# Patient Record
Sex: Female | Born: 1940
Health system: Southern US, Community
[De-identification: ages and names within clinical notes are randomized; demographics above are authoritative.]

## PROBLEM LIST (undated history)

## (undated) DIAGNOSIS — M1990 Unspecified osteoarthritis, unspecified site: Secondary | ICD-10-CM

## (undated) DIAGNOSIS — M8580 Other specified disorders of bone density and structure, unspecified site: Secondary | ICD-10-CM

## (undated) DIAGNOSIS — R1013 Epigastric pain: Secondary | ICD-10-CM

## (undated) DIAGNOSIS — Z8619 Personal history of other infectious and parasitic diseases: Secondary | ICD-10-CM

## (undated) DIAGNOSIS — Z789 Other specified health status: Secondary | ICD-10-CM

## (undated) DIAGNOSIS — I10 Essential (primary) hypertension: Secondary | ICD-10-CM

## (undated) DIAGNOSIS — Z9889 Other specified postprocedural states: Secondary | ICD-10-CM

## (undated) HISTORY — DX: Other specified disorders of bone density and structure, unspecified site: M85.80

## (undated) HISTORY — DX: Epigastric pain: R10.13

## (undated) HISTORY — DX: Other specified health status: Z78.9

## (undated) HISTORY — DX: Other specified postprocedural states: Z98.890

## (undated) HISTORY — PX: TUBAL LIGATION: SHX77

## (undated) HISTORY — PX: COLONOSCOPY: SHX174

## (undated) HISTORY — DX: Essential (primary) hypertension: I10

## (undated) HISTORY — DX: Unspecified osteoarthritis, unspecified site: M19.90

## (undated) HISTORY — PX: UPPER GASTROINTESTINAL ENDOSCOPY: SHX188

## (undated) HISTORY — DX: Personal history of other infectious and parasitic diseases: Z86.19

---

## 1978-01-06 HISTORY — PX: BREAST LUMPECTOMY: SHX2

## 2005-05-20 LAB — HM COLONOSCOPY: HM Colonoscopy: NORMAL

## 2005-06-06 DIAGNOSIS — Z9889 Other specified postprocedural states: Secondary | ICD-10-CM

## 2005-06-06 HISTORY — DX: Other specified postprocedural states: Z98.890

## 2005-06-10 ENCOUNTER — Encounter: Payer: Self-pay | Admitting: Gastroenterology

## 2005-10-08 ENCOUNTER — Encounter: Payer: Self-pay | Admitting: Internal Medicine

## 2005-10-11 ENCOUNTER — Encounter: Payer: Self-pay | Admitting: Internal Medicine

## 2007-02-09 ENCOUNTER — Encounter: Payer: Self-pay | Admitting: Internal Medicine

## 2007-02-22 LAB — CONVERTED CEMR LAB: Pap Smear: NORMAL

## 2007-07-28 ENCOUNTER — Ambulatory Visit: Payer: Self-pay | Admitting: Internal Medicine

## 2007-07-28 DIAGNOSIS — M810 Age-related osteoporosis without current pathological fracture: Secondary | ICD-10-CM

## 2007-08-03 ENCOUNTER — Encounter: Payer: Self-pay | Admitting: Internal Medicine

## 2007-10-04 ENCOUNTER — Ambulatory Visit: Payer: Self-pay | Admitting: Internal Medicine

## 2007-10-04 DIAGNOSIS — K219 Gastro-esophageal reflux disease without esophagitis: Secondary | ICD-10-CM | POA: Insufficient documentation

## 2007-10-04 DIAGNOSIS — I10 Essential (primary) hypertension: Secondary | ICD-10-CM

## 2007-10-06 ENCOUNTER — Ambulatory Visit: Payer: Self-pay | Admitting: Internal Medicine

## 2007-10-11 ENCOUNTER — Encounter: Payer: Self-pay | Admitting: Internal Medicine

## 2007-10-11 ENCOUNTER — Ambulatory Visit: Payer: Self-pay

## 2007-10-12 ENCOUNTER — Ambulatory Visit: Payer: Self-pay | Admitting: Internal Medicine

## 2007-10-19 ENCOUNTER — Telehealth: Payer: Self-pay | Admitting: Internal Medicine

## 2007-11-01 ENCOUNTER — Ambulatory Visit: Payer: Self-pay | Admitting: Gastroenterology

## 2007-11-01 DIAGNOSIS — R1013 Epigastric pain: Secondary | ICD-10-CM | POA: Insufficient documentation

## 2007-12-07 ENCOUNTER — Ambulatory Visit: Payer: Self-pay | Admitting: Internal Medicine

## 2007-12-07 LAB — CONVERTED CEMR LAB
AST: 23 units/L (ref 0–37)
Calcium: 8.6 mg/dL (ref 8.4–10.5)
Creatinine, Ser: 0.4 mg/dL (ref 0.4–1.2)
GFR calc Af Amer: 206 mL/min
GFR calc non Af Amer: 170 mL/min
HDL: 53.2 mg/dL (ref >39.0)
LDL Cholesterol: 99 mg/dL (ref 0–99)
Total CHOL/HDL Ratio: 3.5
Triglycerides: 170 mg/dL — ABNORMAL HIGH (ref 0–149)
VLDL: 34 mg/dL (ref 0–40)

## 2007-12-09 ENCOUNTER — Encounter: Payer: Self-pay | Admitting: Gastroenterology

## 2007-12-09 ENCOUNTER — Ambulatory Visit: Payer: Self-pay | Admitting: Gastroenterology

## 2007-12-13 ENCOUNTER — Ambulatory Visit: Payer: Self-pay | Admitting: Internal Medicine

## 2007-12-14 ENCOUNTER — Encounter: Payer: Self-pay | Admitting: Gastroenterology

## 2008-01-10 ENCOUNTER — Ambulatory Visit: Payer: Self-pay | Admitting: Gastroenterology

## 2008-01-10 DIAGNOSIS — K222 Esophageal obstruction: Secondary | ICD-10-CM

## 2008-02-16 ENCOUNTER — Ambulatory Visit: Payer: Self-pay | Admitting: Internal Medicine

## 2008-02-16 DIAGNOSIS — H6690 Otitis media, unspecified, unspecified ear: Secondary | ICD-10-CM | POA: Insufficient documentation

## 2008-03-16 ENCOUNTER — Ambulatory Visit (HOSPITAL_BASED_OUTPATIENT_CLINIC_OR_DEPARTMENT_OTHER): Admission: RE | Admit: 2008-03-16 | Discharge: 2008-03-16 | Payer: Self-pay | Admitting: Internal Medicine

## 2008-03-16 ENCOUNTER — Ambulatory Visit: Payer: Self-pay | Admitting: Diagnostic Radiology

## 2008-03-16 ENCOUNTER — Ambulatory Visit: Payer: Self-pay | Admitting: Internal Medicine

## 2008-03-16 DIAGNOSIS — R05 Cough: Secondary | ICD-10-CM

## 2008-03-30 ENCOUNTER — Ambulatory Visit: Payer: Self-pay | Admitting: Internal Medicine

## 2008-04-20 ENCOUNTER — Ambulatory Visit (HOSPITAL_BASED_OUTPATIENT_CLINIC_OR_DEPARTMENT_OTHER): Admission: RE | Admit: 2008-04-20 | Discharge: 2008-04-20 | Payer: Self-pay | Admitting: Internal Medicine

## 2008-04-20 ENCOUNTER — Ambulatory Visit: Payer: Self-pay | Admitting: Diagnostic Radiology

## 2008-04-20 LAB — HM MAMMOGRAPHY: HM Mammogram: NEGATIVE

## 2008-05-22 ENCOUNTER — Ambulatory Visit: Payer: Self-pay | Admitting: Internal Medicine

## 2008-05-22 DIAGNOSIS — L03119 Cellulitis of unspecified part of limb: Secondary | ICD-10-CM

## 2008-05-22 DIAGNOSIS — L02419 Cutaneous abscess of limb, unspecified: Secondary | ICD-10-CM | POA: Insufficient documentation

## 2008-07-31 ENCOUNTER — Encounter: Payer: Self-pay | Admitting: Internal Medicine

## 2008-08-06 ENCOUNTER — Ambulatory Visit: Payer: Self-pay | Admitting: Interventional Radiology

## 2008-08-06 ENCOUNTER — Emergency Department (HOSPITAL_BASED_OUTPATIENT_CLINIC_OR_DEPARTMENT_OTHER): Admission: EM | Admit: 2008-08-06 | Discharge: 2008-08-06 | Payer: Self-pay | Admitting: Emergency Medicine

## 2008-10-02 ENCOUNTER — Ambulatory Visit: Payer: Self-pay | Admitting: Internal Medicine

## 2008-10-18 ENCOUNTER — Telehealth: Payer: Self-pay | Admitting: Internal Medicine

## 2008-10-26 ENCOUNTER — Ambulatory Visit: Payer: Self-pay | Admitting: Internal Medicine

## 2008-10-31 ENCOUNTER — Telehealth: Payer: Self-pay | Admitting: Internal Medicine

## 2008-11-09 ENCOUNTER — Ambulatory Visit: Payer: Self-pay | Admitting: Internal Medicine

## 2008-11-09 LAB — CONVERTED CEMR LAB
Albumin: 4.4 g/dL (ref 3.5–5.2)
Alkaline Phosphatase: 48 units/L (ref 39–117)
CO2: 24 meq/L (ref 19–32)
Chloride: 107 meq/L (ref 96–112)
HDL: 69 mg/dL (ref >39)
LDL Cholesterol: 121 mg/dL — ABNORMAL HIGH (ref 0–99)
Potassium: 4.6 meq/L (ref 3.5–5.3)
Sodium: 143 meq/L (ref 135–145)
TSH: 3.845 microintl units/mL (ref 0.350–4.500)
Total Bilirubin: 0.5 mg/dL (ref 0.3–1.2)
Total CHOL/HDL Ratio: 3
Total Protein: 6.7 g/dL (ref 6.0–8.3)
Triglycerides: 102 mg/dL (ref <150)

## 2008-11-16 ENCOUNTER — Ambulatory Visit: Payer: Self-pay | Admitting: Internal Medicine

## 2008-12-26 ENCOUNTER — Telehealth: Payer: Self-pay | Admitting: Internal Medicine

## 2009-01-18 ENCOUNTER — Encounter (INDEPENDENT_AMBULATORY_CARE_PROVIDER_SITE_OTHER): Payer: Self-pay | Admitting: *Deleted

## 2009-01-18 ENCOUNTER — Ambulatory Visit: Payer: Self-pay | Admitting: Internal Medicine

## 2009-01-18 DIAGNOSIS — K120 Recurrent oral aphthae: Secondary | ICD-10-CM | POA: Insufficient documentation

## 2009-01-18 DIAGNOSIS — H538 Other visual disturbances: Secondary | ICD-10-CM

## 2009-01-18 DIAGNOSIS — J309 Allergic rhinitis, unspecified: Secondary | ICD-10-CM | POA: Insufficient documentation

## 2009-03-15 ENCOUNTER — Ambulatory Visit: Payer: Self-pay | Admitting: Internal Medicine

## 2009-03-15 LAB — CONVERTED CEMR LAB
BUN: 15 mg/dL (ref 6–23)
Creatinine, Ser: 0.61 mg/dL (ref 0.40–1.20)
Glucose, Bld: 78 mg/dL (ref 70–99)
Potassium: 4.5 meq/L (ref 3.5–5.3)
Sodium: 143 meq/L (ref 135–145)

## 2009-03-22 ENCOUNTER — Ambulatory Visit: Payer: Self-pay | Admitting: Internal Medicine

## 2009-03-27 LAB — CONVERTED CEMR LAB: Pap Smear: NORMAL

## 2009-04-26 ENCOUNTER — Ambulatory Visit: Payer: Self-pay | Admitting: Family

## 2009-09-07 ENCOUNTER — Ambulatory Visit: Payer: Self-pay | Admitting: Internal Medicine

## 2009-09-17 ENCOUNTER — Encounter: Payer: Self-pay | Admitting: Internal Medicine

## 2009-09-17 LAB — CONVERTED CEMR LAB
BUN: 13 mg/dL (ref 6–23)
Chloride: 108 meq/L (ref 96–112)
Cholesterol: 186 mg/dL (ref 0–200)
FSH: 19.2 milliintl units/mL
Glucose, Bld: 139 mg/dL — ABNORMAL HIGH (ref 70–99)
LDL Cholesterol: 95 mg/dL (ref 0–99)
Potassium: 4.6 meq/L (ref 3.5–5.3)
VLDL: 28 mg/dL (ref 0–40)

## 2009-09-18 ENCOUNTER — Encounter: Payer: Self-pay | Admitting: Internal Medicine

## 2009-09-18 LAB — CONVERTED CEMR LAB: TSH: 3.159 microintl units/mL (ref 0.350–4.500)

## 2009-09-24 ENCOUNTER — Encounter (INDEPENDENT_AMBULATORY_CARE_PROVIDER_SITE_OTHER): Payer: Self-pay | Admitting: *Deleted

## 2009-09-24 ENCOUNTER — Telehealth: Payer: Self-pay | Admitting: Gastroenterology

## 2009-09-24 ENCOUNTER — Ambulatory Visit: Payer: Self-pay | Admitting: Internal Medicine

## 2009-09-24 DIAGNOSIS — H9209 Otalgia, unspecified ear: Secondary | ICD-10-CM | POA: Insufficient documentation

## 2009-10-18 ENCOUNTER — Ambulatory Visit: Payer: Self-pay | Admitting: Internal Medicine

## 2009-10-18 DIAGNOSIS — L259 Unspecified contact dermatitis, unspecified cause: Secondary | ICD-10-CM

## 2009-10-19 ENCOUNTER — Telehealth: Payer: Self-pay | Admitting: Internal Medicine

## 2009-11-06 ENCOUNTER — Ambulatory Visit: Payer: Self-pay | Admitting: Gastroenterology

## 2009-11-06 DIAGNOSIS — K861 Other chronic pancreatitis: Secondary | ICD-10-CM | POA: Insufficient documentation

## 2009-11-07 ENCOUNTER — Encounter: Payer: Self-pay | Admitting: Internal Medicine

## 2009-11-07 ENCOUNTER — Ambulatory Visit: Payer: Self-pay | Admitting: Cardiology

## 2009-11-07 LAB — CONVERTED CEMR LAB
ALT: 17 units/L (ref 0–35)
AST: 21 units/L (ref 0–37)
Basophils Absolute: 0.1 10*3/uL (ref 0.0–0.1)
CO2: 30 meq/L (ref 19–32)
Calcium: 9.1 mg/dL (ref 8.4–10.5)
Chloride: 108 meq/L (ref 96–112)
GFR calc non Af Amer: 105.73 mL/min (ref >60)
Lymphocytes Relative: 35 % (ref 12.0–46.0)
Monocytes Relative: 6.2 % (ref 3.0–12.0)
Neutrophils Relative %: 57.3 % (ref 43.0–77.0)
Platelets: 211 10*3/uL (ref 150.0–400.0)
RDW: 13.6 % (ref 11.5–14.6)
Sodium: 142 meq/L (ref 135–145)
Total Protein: 6.3 g/dL (ref 6.0–8.3)

## 2009-11-27 ENCOUNTER — Ambulatory Visit: Payer: Self-pay | Admitting: Internal Medicine

## 2009-11-27 DIAGNOSIS — J069 Acute upper respiratory infection, unspecified: Secondary | ICD-10-CM | POA: Insufficient documentation

## 2010-01-27 ENCOUNTER — Encounter: Payer: Self-pay | Admitting: Internal Medicine

## 2010-02-04 ENCOUNTER — Ambulatory Visit (INDEPENDENT_AMBULATORY_CARE_PROVIDER_SITE_OTHER)
Admission: RE | Admit: 2010-02-04 | Discharge: 2010-02-04 | Payer: Self-pay | Source: Home / Self Care | Attending: Internal Medicine | Admitting: Internal Medicine

## 2010-02-04 DIAGNOSIS — I10 Essential (primary) hypertension: Secondary | ICD-10-CM

## 2010-02-05 NOTE — Progress Notes (Signed)
Summary: wants more prednisone   Phone Note Call from Patient Call back at Home Phone [phone]   Caller: Patient Call For: yoo  Summary of Call: prednisone 10 mg feels she needs another round as the rash has not cleared up and it is on her face now.  [facility]  Initial call taken by: Roselle Locus,  October 19, 2009 1:06 PM  Follow-up for Phone Call        call returned to patient, she was advised rx sent to pharmacy Follow-up by: Glendell Docker CMA,  October 19, 2009 4:40 PM    New/Updated Medications: PREDNISONE 10 MG TABS (PREDNISONE) 3 tabs by mouth once daily x 3 days, 2 tabs by mouth once daily x 3 days, 1 tab by mouth once daily x 3 days Prescriptions: PREDNISONE 10 MG TABS (PREDNISONE) 3 tabs by mouth once daily x 3 days, 2 tabs by mouth once daily x 3 days, 1 tab by mouth once daily x 3 days  #18 x 0   Entered and Authorized by:   D. Thomos Lemons DO   Signed by:   D. Thomos Lemons DO on 10/19/2009   Method used:   Electronically to        [id number] (retail)       [address] [place], [place]  [address]       Ph: [phone]       Fax: [phone]   RxID:   [id number]

## 2010-02-05 NOTE — Assessment & Plan Note (Signed)
Summary: rash/dt   Vital Signs:  Patient profile:   70 year old female Height:      60 inches Weight:      157.50 pounds BMI:     30.87 O2 Sat:      97 % on Room air Temp:     98.3 degrees F oral Pulse rate:   64 / minute Pulse rhythm:   regular Resp:     18 per minute BP sitting:   130 / 70  (right arm) Cuff size:   regular  Vitals Entered By: Glendell Docker CMA (October 18, 2009 3:27 PM)  O2 Flow:  Room air CC: Rash Is Patient Diabetic? No Pain Assessment Patient in pain? no      Comments c/o sudden onset of rash on face and arms with sever irritation, not sure of the cause   Primary Care Provider:  Dondra Spry DO  CC:  Rash.  History of Present Illness:  Rash      This is a Patricia Hansen who presents with Rash.  The patient reports macules.  The rash is located on the face,  and right arm.  The patient reports a history of new topical exposure.  pt thinks she was exposed to poison oak while she was golfing   Preventive Screening-Counseling & Management  Alcohol-Tobacco     Smoking Status: never  Allergies (verified): No Known Drug Allergies  Past History:  Past Medical History: Osteopenia Intolerance to bisphosphonates  Chronic dyspepsia  -  S/P EGD (negative)  Hx of H. Pylori - treated x 2  Colonoscopy 06/2005     Hypertension   Family History: Mother died of uterine cancer Father deceased - hx of htn No breast cancer  No diabetes  No colon cancer        Social History: Retired Married > 40 yrs  4 daughters  Never Smoked    Alcohol use-no          Physical Exam  General:  alert, well-developed, and well-nourished.   Lungs:  normal respiratory effort and normal breath sounds.   Heart:  normal rate, regular rhythm, and no gallop.   Skin:  maculopapular rash right lower cheek, and 1 cm patch on right arm   Impression & Recommendations:  Problem # 1:  CONTACT DERMATITIS (ICD-692.9)  Her updated medication list for this problem  includes:    Prednisone 10 Mg Tabs (Prednisone) .Marland KitchenMarland KitchenMarland KitchenMarland Kitchen 3 tabs by mouth once daily x 3 days, 2 tabs by mouth once daily x 3 days, 1 tab by mouth once daily x 3 days    Triamcinolone Acetonide 0.5 % Crea (Triamcinolone acetonide) .Marland Kitchen... Apply two times a day  Discussed avoidance of triggers and symptomatic treatment.   Complete Medication List: 1)  Creon 24000 Unit Cpep (Pancrelipase (lip-prot-amyl)) .Marland Kitchen.. 1 capsule three times a day with meals as needed 2)  Azor 5-20 Mg Tabs (Amlodipine-olmesartan) .... Take 1 tablet by mouth once a day 3)  Evista 60 Mg Tabs (Raloxifene hcl) .... Take 1 tablet by mouth once a day 4)  Prednisone 10 Mg Tabs (Prednisone) .... 3 tabs by mouth once daily x 3 days, 2 tabs by mouth once daily x 3 days, 1 tab by mouth once daily x 3 days 5)  Triamcinolone Acetonide 0.5 % Crea (Triamcinolone acetonide) .... Apply two times a day  Patient Instructions: 1)  Call our office if your rash does not  improve or gets worse. Prescriptions: TRIAMCINOLONE ACETONIDE 0.5 %  CREA (TRIAMCINOLONE ACETONIDE) apply two times a day  #30 grams x 0   Entered and Authorized by:   D. Thomos Lemons DO   Signed by:   D. Thomos Lemons DO on 10/18/2009   Method used:   Electronically to        [id number] (retail)       [address] [place], [place]  [address]       Ph: [phone]       Fax: [phone]   RxID:   [id number] PREDNISONE 10 MG TABS (PREDNISONE) one by mouth two times a day x 5 days,  then 1 by mouth once daily x 5 days  #15 x 0   Entered and Authorized by:   D. Thomos Lemons DO   Signed by:   D. Thomos Lemons DO on 10/18/2009   Method used:   Electronically to        [facility] (retail)       [address] [place], [place]  [address]       Ph: [phone]       Fax: [phone]   RxID:   [id number]   Current Allergies (reviewed today): No known allergies       Preventive  Care Screening  Mammogram:    Date:  06/26/2009    Results:  normal   Pap Smear:    Date:  03/27/2009    Results:  normal

## 2010-02-05 NOTE — Assessment & Plan Note (Signed)
Summary: cold  ,cough  /hea   Vital Signs:  Patient profile:   70 year old female Height:      62 inches Weight:      156.75 pounds BMI:     28.77 O2 Sat:      98 % on Room air Temp:     98.5 degrees F oral Pulse rate:   67 / minute Resp:     22 per minute BP sitting:   122 / 68  (right arm) Cuff size:   regular  Vitals Entered By: Glendell Docker CMA (November 27, 2009 9:55 AM)  O2 Flow:  Room air CC: Cold, URI symptoms Is Patient Diabetic? No Pain Assessment Patient in pain? no      Comments c/o  non-productive cough, nasal drainage clear in color, head congestion, throat irritation,  for the past 3 days, Tylenol with some relief, denies temperature   Primary Care Andron Marrazzo:  Dondra Spry DO  CC:  Cold and URI symptoms.  History of Present Illness:  URI Symptoms      This is a 70 year old woman who presents with URI symptoms.  The patient reports nasal congestion, clear nasal discharge, and dry cough.  The patient denies fever and low-grade fever (<100.5 degrees).  The patient denies severe fatigue.  onset 3 days. no sob  Preventive Screening-Counseling & Management  Alcohol-Tobacco     Smoking Status: never  Allergies (verified): No Known Drug Allergies  Past History:  Past Medical History: Osteopenia  Intolerance to bisphosphonates  Chronic dyspepsia  -  S/P EGD (negative 2009) Hx of H. Pylori - treated x 2  Colonoscopy 06/2005, no polyps found, next colonoscopy 2017 Hypertension    Past Surgical History: Breast-Lumpectomy           Family History: Mother died of uterine cancer Father deceased - hx of htn No breast cancer  No diabetes  No colon cancer           Social History: Retired Married > 40 yrs  4 daughters  Never Smoked     Alcohol use-no            Physical Exam  General:  alert, well-developed, and well-nourished.   Neck:  supple and no masses.   Lungs:  normal respiratory effort and normal breath sounds.   Heart:  normal  rate, regular rhythm, no murmur, and no gallop.   Neurologic:  cranial nerves II-XII intact and gait normal.     Impression & Recommendations:  Problem # 1:  URI (ICD-465.9)  Her updated medication list for this problem includes:    Hydrocodone-homatropine 5-1.5 Mg/22ml Syrp (Hydrocodone-homatropine) .Marland KitchenMarland KitchenMarland KitchenMarland Kitchen 5 ml by mouth two times a day as needed  Instructed on symptomatic treatment. Call if symptoms persist or worsen.   Complete Medication List: 1)  Creon 24000 Unit Cpep (Pancrelipase (lip-prot-amyl)) .Marland Kitchen.. 1 capsule three times a day with meals as needed 2)  Azor 5-20 Mg Tabs (Amlodipine-olmesartan) .... Take 1 tablet by mouth once a day 3)  Evista 60 Mg Tabs (Raloxifene hcl) .... Take 1 tablet by mouth once a day 4)  Triamcinolone Acetonide 0.5 % Crea (Triamcinolone acetonide) .... Apply two times a day 5)  Hydrocodone-homatropine 5-1.5 Mg/57ml Syrp (Hydrocodone-homatropine) .... 5 ml by mouth two times a day as needed  Patient Instructions: 1)  Patient advised to call office if symptoms persist or worsen. Prescriptions: HYDROCODONE-HOMATROPINE 5-1.5 MG/5ML SYRP (HYDROCODONE-HOMATROPINE) 5 ml by mouth two times a day as needed  #  90 x 0   Entered and Authorized by:   D. Thomos Lemons DO   Signed by:   D. Thomos Lemons DO on 11/27/2009   Method used:   Print then Give to Patient   RxID:   [id number]    Orders Added: 1)  Est. Patient Level III [56213]       Current Allergies (reviewed today): No known allergies

## 2010-02-05 NOTE — Progress Notes (Signed)
Summary: MD Switch Request  Phone Note From Other Clinic Call back at Home Phone [phone]   Caller: Patient Call For: Dr. Page Spiro: Patricia Hansen, scheduler Call For: Dr. Arlyce Dice Summary of Call: pt would like to switch from Dr. Arlyce Dice to Dr. Christella Hartigan because her husband is a pt of Dr. Christella Hartigan  Initial call taken by: Vallarie Mare,  September 24, 2009 8:49 AM  Follow-up for Phone Call        DR.KAPLAN--Do you approve switch? Laureen Ochs LPN  September 24, 2009 8:55 AM   Additional Follow-up for Phone Call Additional follow up Details #1::        yes Additional Follow-up by: Louis Meckel MD,  September 24, 2009 9:51 AM    Additional Follow-up for Phone Call Additional follow up Details #2::    DR.JACOBS--Will you accept this patient? Laureen Ochs LPN  September 24, 2009 9:57 AM   Additional Follow-up for Phone Call Additional follow up Details #3:: Details for Additional Follow-up Action Taken: yes that is ok.  please schedule for next available new GI patient visit Additional Follow-up by: Rachael Fee MD,  September 24, 2009 10:07 AM   Appended Document: MD Switch Request Message left for pt. advising her of MD switch approval. I also let her know she is scheduled with Dr.Jacobs for 11-06-09 at 3pm. Pt. instructed to call back as needed.

## 2010-02-05 NOTE — Letter (Signed)
Summary: Primary Care Consult Scheduled Letter  [place] [facility]  [address]   [place], [place] [address]   Phone: [phone]  Fax: [phone]      09/24/2009 MRN: [id number]  Patricia Hansen [address] [place], [place]  [address]    Dear [name],      We have scheduled an appointment for you.  At the recommendation of Dr.YOO, we have scheduled you a consult with DR SHOEMAKER AT [facility]  on _OCTOBER 10,2011 at 2:30PM.  Their address is__[address]  . The office phone number is [phone].  If this appointment day and time is not convenient for you, please feel free to call the office of the doctor you are being referred to at the number listed above and reschedule the appointment.     It is important for you to keep your scheduled appointments. We are here to make sure you are given good patient care. If you have questions or you have made changes to your appointment, please notify us at  [phone], ask for HELEN.    Thank you,  Darral Dash Patient Care Coordinator [place] at [facility]

## 2010-02-05 NOTE — Letter (Signed)
Summary: Primary Care Consult Scheduled Letter  [place] [facility]  [address]   [place], [place] [address]   Phone: [phone]  Fax: [phone]      01/18/2009 MRN: [id number]  Patricia Hansen [address] [place], [place]  [address]    Dear [name],      We have scheduled an appointment for you.  At the recommendation of Dr.Yoo, we have scheduled you a consult with Dr Dione Booze, @ [facility] on January 27,2011 at 3:15pm .  The address is [address] , [place] . The office phone number is [phone].  If this appointment day and time is not convenient for you, please feel free to call the office of the doctor you are being referred to at the number listed above and reschedule the appointment.     It is important for you to keep your scheduled appointments. We are here to make sure you are given good patient care. If you have questions or you have made changes to your appointment, please notify us at  [phone], ask for [facility].    Thank you,  Patient Care Coordinator [place] at [facility]

## 2010-02-05 NOTE — Assessment & Plan Note (Signed)
Summary: ST/HEA   Vital Signs:  Patient profile:   70 year old female Height:      60 inches Weight:      160 pounds BMI:     31.36 O2 Sat:      97 % on Room air Temp:     98.2 degrees F oral Pulse rate:   65 / minute Pulse rhythm:   regular Resp:     18 per minute BP sitting:   116 / 60  (right arm) Cuff size:   regular  Vitals Entered By: Glendell Docker CMA (April 26, 2009 8:45 AM)  O2 Flow:  Room air CC: Rm 5- Cough Is Patient Diabetic? No Comments sore throat, pain with swallowing, for the past 3-4 days, and mouth irritation   Primary Care Provider:  DThomos Lemons DO  CC:  Rm 5- Cough.  History of Present Illness: Patricia Hansen is a 70 year old female who presents with c/o ulcers in her mouth which have been intermittent x 2 months.  Also + sore throat x 4-5 days.  Denies cough or nasal congestion or sick contacts.  Has tried Tylenol which did not help her symptoms.  Allergies (verified): No Known Drug Allergies  Physical Exam  General:  Well-developed,well-nourished,in no acute distress; alert,appropriate and cooperative throughout examination Head:  Normocephalic and atraumatic without obvious abnormalities. No apparent alopecia or balding. Eyes:  PERRLA Ears:  R TM red and bulging.  Mouth:  +apthous ulcer noted under tongue.   Neck:  No deformities, masses, or tenderness noted. Lungs:  Normal respiratory effort, chest expands symmetrically. Lungs are clear to auscultation, no crackles or wheezes. Heart:  Normal rate and regular rhythm. S1 and S2 normal without gallop, murmur, click, rub or other extra sounds.   Impression & Recommendations:  Problem # 1:  OTITIS MEDIA, ACUTE, RIGHT (ICD-382.9) Assessment New  Will treat with amoxicillin.  Her updated medication list for this problem includes:    Amoxicillin 500 Mg Cap (Amoxicillin) .Marland Kitchen... Take 1 capsule by mouth three times a day x 10 days  Orders: Prescription Created Electronically [phone])  Problem # 2:   APHTHOUS ULCERS (ICD-528.2) Assessment: Deteriorated Add Magic mouthwash as needed   Complete Medication List: 1)  Creon 24000 Unit Cpep (Pancrelipase (lip-prot-amyl)) .Marland Kitchen.. 1 capsule three times a day with meals as needed 2)  Azor 5-20 Mg Tabs (Amlodipine-olmesartan) .... Take 1 tablet by mouth once a day 3)  Amoxicillin 500 Mg Cap (Amoxicillin) .... Take 1 capsule by mouth three times a day x 10 days 4)  Magic Mouthwash  .... 5ml swish and spit three times a day as needed mouth pain  Other Orders: Rapid Strep (60454)  Patient Instructions: 1)  Please call if you have fever over 101, if symptoms worsen, or if they do not improve. 2)  Have a nice Easter! Prescriptions: MAGIC MOUTHWASH 5ml swish and spit three times a day as needed mouth pain  #289ml x 0   Entered and Authorized by:   Lemont Fillers FNP   Signed by:   Lemont Fillers FNP on 04/26/2009   Method used:   Print then Give to Patient   RxID:   [id number] AMOXICILLIN 500 MG CAP (AMOXICILLIN) Take 1 capsule by mouth three times a day X 10 days  #30 x 0   Entered and Authorized by:   Lemont Fillers FNP   Signed by:   Lemont Fillers FNP on 04/26/2009   Method used:  Electronically to        [facility] (retail)       [address] [place], [place]  [address]       Ph: [phone]       Fax: [phone]   RxID:   [id number]   Current Allergies (reviewed today): No known allergies   Laboratory Results    Other Tests  Rapid Strep: negative

## 2010-02-05 NOTE — Assessment & Plan Note (Signed)
Summary: flu shot/mhf  Nurse Visit   Allergies: No Known Drug Allergies  Immunizations Administered:  Influenza Vaccine # 1:    Vaccine Type: Fluvax MCR    Site: left deltoid    Mfr: GlaxoSmithKline    Dose: 0.5 ml    Route: IM    Given by: Glendell Docker CMA    Exp. Date: 07/06/2010    Lot #: [id number]    VIS given: 07/31/09 version given September 07, 2009.  Flu Vaccine Consent Questions:    Do you have a history of severe allergic reactions to this vaccine? no    Any prior history of allergic reactions to egg and/or gelatin? no    Do you have a sensitivity to the preservative Thimersol? no    Do you have a past history of Guillan-Barre Syndrome? no    Do you currently have an acute febrile illness? no    Have you ever had a severe reaction to latex? no    Vaccine information given and explained to patient? yes    Are you currently pregnant? no  Orders Added: 1)  Influenza Vaccine MCR [00025] 2)  Administration Flu vaccine - MCR [G0008]

## 2010-02-05 NOTE — Assessment & Plan Note (Signed)
History of Present Illness Visit Type: Initial Consult Primary GI MD: Melvia Heaps MD [facility] Primary Provider: Dondra Spry DO Requesting Provider: Thomos Lemons MD Chief Complaint: esophageal stricture History of Present Illness:     Patricia 70 year old Asian Hansen who has "digestive problems," and has been on creon for many years.  If she skips the creon, she has gotten stomach upset.  Overall her weight is down a bit, she is trying to lose weight.  Walks a lot, plays golf.    she has never had pancreaticDisease that I can tell, never alcoholic, never acute pancreatitis. I see no documentation of her having pancreatic insufficiency.  She has no problems with swallowng now, no dysphagia.    She has no constipation or diarrhea.    Feel full after eating small meals, this has been going on for many years.           Current Medications (verified): 1)  Creon 24000 Unit Cpep (Pancrelipase (Lip-Prot-Amyl)) .Marland Kitchen.. 1 Capsule Three Times A Day With Meals As Needed 2)  Azor 5-20 Mg Tabs (Amlodipine-Olmesartan) .... Take 1 Tablet By Mouth Once A Day 3)  Evista 60 Mg Tabs (Raloxifene Hcl) .... Take 1 Tablet By Mouth Once A Day 4)  Triamcinolone Acetonide 0.5 % Crea (Triamcinolone Acetonide) .... Apply Two Times A Day  Allergies (verified): No Known Drug Allergies  Past History:  Past Medical History: Osteopenia Intolerance to bisphosphonates  Chronic dyspepsia  -  S/P EGD (negative 2009) Hx of H. Pylori - treated x 2  Colonoscopy 06/2005, no polyps found, next colonoscopy 2017 Hypertension    Past Surgical History: Breast-Lumpectomy          Family History: Mother died of uterine cancer Father deceased - hx of htn No breast cancer  No diabetes  No colon cancer          Social History: Retired Married > 40 yrs  4 daughters  Never Smoked    Alcohol use-no            Review of Systems       Pertinent positive and negative review of systems were noted in the above  HPI and GI specific review of systems.  All other review of systems was otherwise negative.   Vital Signs:  Patient profile:   70 year old female Height:      62 inches Weight:      156 pounds BMI:     28.64 Pulse rate:   84 / minute Pulse rhythm:   regular BP sitting:   120 / 72  (left arm) Cuff size:   regular  Vitals Entered By: June McMurray CMA Duncan Dull) (November 06, 2009 2:59 PM)  Physical Exam  Additional Exam:  Constitutional: generally well appearing Psychiatric: alert and oriented times 3 Eyes: extraocular movements intact Mouth: oropharynx moist, no lesions Neck: supple, no lymphadenopathy Cardiovascular: heart regular rate and rythm Lungs: CTA bilaterally Abdomen: soft, non-tender, non-distended, no obvious ascites, no peritoneal signs, normal bowel sounds Extremities: no lower extremity edema bilaterally Skin: no lesions on visible extremities    Impression & Recommendations:  Problem # 1:  Dyspepsia she has been on pancreatic enzymes supplements for many years for unclear reasons. She has never heard that she had pancreatic disease and she has no signs of pancreatic insufficiency. She does say that some vague dyspeptic symptoms are usually better when she takes the pancreatic enzymes supplements however. I recommend since she is so clear that these enzymes  supplements are helping her, that we perform a CT scan to check for signs of chronic pancreatitis. She wanted an EGD to be performed however she had one less than 2 years ago and the stomach was normal, she did have mild duodenitis. She had an esophageal stricture that was dilated. She has no dysphasia symptoms and no alarm symptoms to warrant repeat EGD at this point.    Problem # 2:  routine risk for colon cancer she is in our recall system for repeat colonoscopy at 10 year interval, 2017.  Other Orders: TLB-CBC Platelet - w/Differential (85025-CBCD) TLB-CMP (Comprehensive Metabolic Pnl)  (80053-COMP) TLB-Sedimentation Rate (ESR) (85652-ESR)  Patient Instructions: 1)  Needs recall colonoscopy in June, 2017. 2)  You will be scheduled for a CT scan of abdomen and pelvis with IV and oral contrast for abdominal pains. Check for chronic pancreatitis. 3)  Lactose intolerance handout. 4)  A copy of this information will be sent to Dr. Artist Pais. 5)  You will get lab test(s) done today (cbc, cmet, esr). 6)  The medication list was reviewed and reconciled.  All changed / newly prescribed medications were explained.  A complete medication list was provided to the patient / caregiver.  Appended Document: Orders Update/CT    Clinical Lists Changes  Problems: Added new problem of CHRONIC PANCREATITIS (ICD-577.1) Orders: Added new Referral order of CT Abdomen/Pelvis with Contrast (CT Abd/Pelvis w/con) - Signed

## 2010-02-05 NOTE — Assessment & Plan Note (Signed)
Summary: 6 MONTH FOLLOW UP/MHF--Rm 2   Vital Signs:  Patient profile:   70 year old female Height:      60 inches Weight:      156 pounds BMI:     30.58 O2 Sat:      95 % on Room air Temp:     98.7 degrees F oral Pulse rate:   68 / minute Pulse rhythm:   regular Resp:     16 per minute BP sitting:   130 / 74  (right arm) Cuff size:   regular  Vitals Entered By: Mervin Kung CMA (AAMA) (September 24, 2009 8:00 AM)  O2 Flow:  Room air CC: Rm 2   6 month f/u. Pain Assessment Patient in pain? no        Primary Care Provider:  DThomos Lemons DO  CC:  Rm 2   6 month f/u.Marland Kitchen  History of Present Illness: 70 y/o [name] for f/u pt c/o intermittent abd discomfort some dysphagia.  she has had EGD in the past she worries about pancreatic cancer  she also c/o bilateral ear pain she denies bruxism TMs are chronically red  Preventive Screening-Counseling & Management  Alcohol-Tobacco     Smoking Status: never  Allergies (verified): No Known Drug Allergies  Past History:  Past Medical History: Osteopenia Intolerance to bisphosphonates  Chronic dyspepsia  -  S/P EGD (negative)  Hx of H. Pylori - treated x 2  Colonoscopy 06/2005     Hypertension   Past Surgical History: Breast-Lumpectomy        Family History: Mother died of uterine cancer Father deceased - hx of htn No breast cancer  No diabetes  No colon cancer       Social History: Retired Married > 40 yrs  4 daughters  Never Smoked   Alcohol use-no          Review of Systems       occ belching,   no buring stomach sensation no hx of bruxism  Physical Exam  General:  alert, well-developed, and well-nourished.   Head:  no TMJ tenderness Ears:  right TM slightly erythematous Mouth:  pharynx pink and moist.   Neck:  supple and no masses.   Lungs:  normal respiratory effort and normal breath sounds.   Heart:  normal rate, regular rhythm, and no gallop.   Abdomen:  mild epigastric  tendernesssoft, no hepatomegaly, and no splenomegaly.   Extremities:  No lower extremity edema Neurologic:  cranial nerves II-XII intact and gait normal.     Impression & Recommendations:  Problem # 1:  EAR PAIN, BILATERAL (ICD-388.70) pt has persistent bilateral ear pain despite tx with ceftin.  mild redness of right TM.  consider TMJ.  pt requests ENT referral - refer to Dr. Annalee Genta  Orders: ENT Referral (ENT)  Problem # 2:  STRICTURE AND STENOSIS OF ESOPHAGUS (ICD-530.3) pt c/o dysphagia.  she also has epigastric discomfort.  no fam hx pancreatic cancer but she worries whether something is wrong with her pancrease.  defer to Dr. Christella Hartigan whether CT of abd indicated she requests referral specifically to Dr. Christella Hartigan Orders: Gastroenterology Referral (GI)  Problem # 3:  HYPERTENSION (ICD-401.9) Assessment: Unchanged  Her updated medication list for this problem includes:    Azor 5-20 Mg Tabs (Amlodipine-olmesartan) .Marland Kitchen... Take 1 tablet by mouth once a day  BP today: 130/74 Prior BP: 116/60 (04/26/2009)  Labs Reviewed: K+: 4.6 (09/17/2009) Creat: : 0.67 (09/17/2009)   Chol: 186 (  09/17/2009)   HDL: 63 (09/17/2009)   LDL: 95 (09/17/2009)   TG: 139 (09/17/2009)  Complete Medication List: 1)  Creon 24000 Unit Cpep (Pancrelipase (lip-prot-amyl)) .Marland Kitchen.. 1 capsule three times a day with meals as needed 2)  Azor 5-20 Mg Tabs (Amlodipine-olmesartan) .... Take 1 tablet by mouth once a day 3)  Magic Mouthwash  .... 5ml swish and spit three times a day as needed mouth pain 4)  Evista 60 Mg Tabs (Raloxifene hcl) .... Take 1 tablet by mouth once a day 5)  Ranitidine Hcl 150 Mg Tabs (Ranitidine hcl) .... One by mouth once daily to two times a day as needed  Patient Instructions: 1)  Please schedule a follow-up appointment in 6 months. Prescriptions: RANITIDINE HCL 150 MG TABS (RANITIDINE HCL) one by mouth once daily to two times a day as needed  #60 x 5   Entered and Authorized by:   D.  Thomos Lemons DO   Signed by:   D. Thomos Lemons DO on 09/24/2009   Method used:   Electronically to        [id number]* (retail)       [address] [place], [place]  [address]       Ph: [phone]       Fax: [phone]   RxID:   [id number]   Current Allergies (reviewed today): No known allergies   Prevention & Chronic Care Immunizations   Influenza vaccine: Fluvax MCR  (09/07/2009)    Tetanus booster: 12/13/2007: Tdap    Pneumococcal vaccine: Pneumovax  (07/28/2007)    H. zoster vaccine: 10/26/2008: Zostavax  Colorectal Screening   Hemoccult: Not documented    Colonoscopy: normal  (05/20/2005)  Other Screening   Pap smear: normal  (03/28/2008)    Mammogram: ASSESSMENT: Negative - BI-RADS 1^MM DIGITAL SCREENING  (04/20/2008)    DXA bone density scan: abnormal  (10/08/2005)   Smoking status: never  (09/24/2009)  Lipids   Total Cholesterol: 186  (09/17/2009)   LDL: 95  (09/17/2009)   LDL Direct: Not documented   HDL: 63  (09/17/2009)   Triglycerides: 139  (09/17/2009)  Hypertension   Last Blood Pressure: 130 / 74  (09/24/2009)   Serum creatinine: 0.67  (09/17/2009)   Serum potassium 4.6  (09/17/2009)  Self-Management Support :    Hypertension self-management support: Not documented

## 2010-02-05 NOTE — Assessment & Plan Note (Signed)
Summary: roa/mhf   Vital Signs:  Patient profile:   70 year old female Weight:      161 pounds BMI:     31.56 O2 Sat:      97 % on Room air Temp:     97.9 degrees F oral Pulse rate:   63 / minute Pulse rhythm:   regular Resp:     18 per minute BP sitting:   118 / 58  (right arm) Cuff size:   regular  Vitals Entered By: Mervin Kung CMA (March 22, 2009 10:04 AM)  O2 Flow:  Room air CC: room 4  Follow up after starting new BP med. Is Patient Diabetic? No   Primary Care Provider:  Dondra Spry DO  CC:  room 4  Follow up after starting new BP med.Patricia Hansen  History of Present Illness:  Hypertension Follow-Up      This is a 70 year old woman who presents for Hypertension follow-up.  The patient denies lightheadedness and edema.  The patient denies the following associated symptoms: chest pain and chest pressure.  Compliance with medications (by patient report) has been near 100%.  The patient reports exercising 3-4X per week.  she likes to golf with her husband.  they walk golf course  Preventive Screening-Counseling & Management  Alcohol-Tobacco     Smoking Status: never  Allergies (verified): No Known Drug Allergies  Past History:  Past Medical History: Osteopenia Intolerance to bisphosphonates  Chronic dyspepsia  -  S/P EGD (negative)  Hx of H. Pylori - treated x 2  Colonoscopy 06/2005     Hypertension  Past Surgical History: Breast-Lumpectomy       Family History: Mother died of uterine cancer Father deceased - hx of htn No breast cancer  No diabetes  No colon cancer      Social History: Retired Married > 40 yrs  4 daughters  Never Smoked   Alcohol use-no         Physical Exam  General:  alert, well-developed, and well-nourished.   Lungs:  normal respiratory effort and normal breath sounds.   Heart:  normal rate, regular rhythm, no murmur, and no gallop.   Extremities:  trace left pedal edema and trace right pedal edema.     Impression &  Recommendations:  Problem # 1:  HYPERTENSION (ICD-401.9) Pt restarted on Azor due to elevated BP 150s -160's systolic.  she is tolerating azor.  Maintain current medication regimen.  Her updated medication list for this problem includes:    Azor 5-20 Mg Tabs (Amlodipine-olmesartan) .Patricia Hansen... Take 1 tablet by mouth once a day  BP today: 118/58 Prior BP: 96/60 (01/18/2009)  Labs Reviewed: K+: 4.5 (03/15/2009) Creat: : 0.61 (03/15/2009)   Chol: 210 (11/09/2008)   HDL: 69 (11/09/2008)   LDL: 121 (11/09/2008)   TG: 102 (11/09/2008)  Complete Medication List: 1)  Creon 24000 Unit Cpep (Pancrelipase (lip-prot-amyl)) .Patricia Hansen.. 1 capsule three times a day with meals as needed 2)  Azor 5-20 Mg Tabs (Amlodipine-olmesartan) .... Take 1 tablet by mouth once a day  Patient Instructions: 1)  Please schedule a follow-up appointment in 6 months. 2)  BMP prior to visit, ICD-9: 401.9 3)  Please return for lab work one (1) week before your next appointment.  Prescriptions: CREON 24000 UNIT CPEP (PANCRELIPASE (LIP-PROT-AMYL)) 1 capsule three times a day with meals as needed  #270 x 3   Entered and Authorized by:   D. Thomos Lemons DO   Signed by:  Dondra Spry DO on 03/22/2009   Method used:   Electronically to        [facility]* (mail-order)             ,          Ph: [phone]       Fax: [phone]   RxID:   [id number] AZOR 5-20 MG TABS (AMLODIPINE-OLMESARTAN) Take 1 tablet by mouth once a day  #90 x 3   Entered and Authorized by:   D. Thomos Lemons DO   Signed by:   D. Thomos Lemons DO on 03/22/2009   Method used:   Electronically to        [facility]* (mail-order)             ,          Ph: [phone]       Fax: [phone]   RxID:   [id number]   Current Allergies (reviewed today): No known allergies

## 2010-02-05 NOTE — Miscellaneous (Signed)
Summary: Orders Update  Clinical Lists Changes  Orders: Added new Test order of T-Basic Metabolic Panel [phone]) - Signed Added new Test order of T-Lipid Profile [phone]) - Signed Added new Test order of T-FSH ([id number]) - Signed

## 2010-02-05 NOTE — Consult Note (Signed)
Summary: [facility]  [facility]   Imported By: Lanelle Bal 11/21/2009 08:44:00  _____________________________________________________________________  External Attachment:    Type:   Image     Comment:   External Document

## 2010-02-05 NOTE — Assessment & Plan Note (Signed)
Summary: 1 month follow up pt to come at 8:45   Vital Signs:  Patient profile:   70 year old female Weight:      162.25 pounds BMI:     31.80 O2 Sat:      96 % on Room air Temp:     98.0 degrees F oral Pulse rate:   64 / minute Pulse rhythm:   regular Resp:     16 per minute BP sitting:   96 / 60  (right arm) Cuff size:   regular  Vitals Entered By: Glendell Docker CMA (January 18, 2009 8:45 AM)  O2 Flow:  Room air  Primary Care Provider:  D. Thomos Lemons DO  CC:  vision changes & BP evaluation.  History of Present Illness: 70 y/o [name] c/o eleveation in blood pressure for the past 3 weeks.    she previously stopped BP meds but she restarted after noticing BP elevaation.   she has also noticed  blurred vision for the past 2 months  she also c/o nasal congestion.  discharge is clear. no fever eyes are dry  Preventive Screening-Counseling & Management  Alcohol-Tobacco     Smoking Status: never  Allergies (verified): No Known Drug Allergies  Past History:  Past Medical History: Osteopenia Intolerance to bisphosphonates  Chronic dyspepsia  -  S/P EGD (negative)  Hx of H. Pylori - treated x 2  Colonoscopy 06/2005    Hypertension  Family History: Mother died of uterine cancer Father deceased - hx of htn No breast cancer  No diabetes  No colon cancer     Social History: Retired Married > 40 yrs  4 daughters  Never Smoked   Alcohol use-no        Physical Exam  General:  alert, well-developed, and well-nourished.   Ears:  R ear normal and L ear normal.   Mouth:  postnasal drip.  small apthous ulcers Neck:  supple, no masses, and no carotid bruits.   Lungs:  normal respiratory effort and normal breath sounds.   Heart:  normal rate, regular rhythm, no murmur, and no gallop.   Extremities:  No lower extremity edema    Impression & Recommendations:  Problem # 1:  ALLERGIC RHINITIS (ICD-477.9) I suspect indoor allergen.  use OTC nasal saline  sprays  Problem # 2:  APHTHOUS ULCERS (ICD-528.2) use orabase over the counter  Problem # 3:  BLURRED VISION (ICD-368.8) dry eyes and blurred vision.  use antihistamine eye drops.    Orders: Ophthalmology Referral (Ophthalmology)  Complete Medication List: 1)  Creon 24000 Unit Cpep (Pancrelipase (lip-prot-amyl)) .Marland Kitchen.. 1 capsule three times a day with meals as needed 2)  Azor 5-20 Mg Tabs (Amlodipine-olmesartan) .... Take 1 tablet by mouth once a day 3)  Optivar 0.05 % Soln (Azelastine hcl) .... One drop to affected eye bid  Patient Instructions: 1)  Use orabase (over the counter) for sores in the mouth 2)  Gargle with warm salt water as needed for dry, irritated throat 3)  Please schedule a follow-up appointment in 2 months. 4)  BMP prior to visit, ICD-9:  401.9 5)  Please return for lab work one (1) week before your next appointment.  Prescriptions: OPTIVAR 0.05 % SOLN (AZELASTINE HCL) one drop to affected eye bid  #1 bottle x 1   Entered and Authorized by:   D. Thomos Lemons DO   Signed by:   D. Thomos Lemons DO on 01/18/2009   Method used:   Print then  Give to Patient   RxID:   [id number] AZOR 5-20 MG TABS (AMLODIPINE-OLMESARTAN) Take 1 tablet by mouth once a day  #90 x 3   Entered and Authorized by:   D. Thomos Lemons DO   Signed by:   D. Thomos Lemons DO on 01/18/2009   Method used:   Print then Give to Patient   RxID:   [id number]   Current Allergies (reviewed today): No known allergies

## 2010-02-27 NOTE — Assessment & Plan Note (Signed)
Summary: med refills for new ins/mhf   Vital Signs:  Patient profile:   70 year old female Height:      62 inches Weight:      157.25 pounds BMI:     28.87 O2 Sat:      97 % on Room air Temp:     97.7 degrees F oral Pulse rate:   62 / minute Resp:     16 per minute BP sitting:   110 / 60  (left arm) Cuff size:   regular  Vitals Entered By: Glendell Docker CMA (February 04, 2010 2:30 PM)  O2 Flow:  Room air CC: follow-up visit Is Patient Diabetic? No Pain Assessment Patient in pain? no      Comments had insurance change and requesting new rx's, requested printed rx's for refill   Primary Care Provider:  D. Thomos Lemons DO  CC:  follow-up visit.  History of Present Illness:  Hypertension Follow-Up      This is a 70 year old woman who presents for Hypertension follow-up.  The patient denies lightheadedness and edema.  The patient denies the following associated symptoms: chest pain.  Compliance with medications (by patient report) has been near 100%.  The patient reports that dietary compliance has been fair.    Preventive Screening-Counseling & Management  Alcohol-Tobacco     Smoking Status: never  Allergies (verified): No Known Drug Allergies  Past History:  Past Medical History: Osteopenia  Intolerance to bisphosphonates    Chronic dyspepsia  -  S/P EGD (negative 2009) Hx of H. Pylori - treated x 2  Colonoscopy 06/2005, no polyps found, next colonoscopy 2017 Hypertension    Family History: Mother died of uterine cancer Father deceased - hx of htn No breast cancer  No diabetes  No colon cancer            Social History: Retired Married > 40 yrs  4 daughters  Never Smoked     Alcohol use-no             Physical Exam  General:  alert, well-developed, and well-nourished.   Lungs:  normal respiratory effort and normal breath sounds.   Heart:  normal rate, regular rhythm, and no gallop.   Extremities:  trace left pedal edema.     Impression &  Recommendations:  Problem # 1:  HYPERTENSION (ICD-401.9) Assessment Improved  Her updated medication list for this problem includes:    Azor 5-20 Mg Tabs (Amlodipine-olmesartan) .Marland Kitchen... Take 1 tablet by mouth once a day  BP today: 110/60 Prior BP: 122/68 (11/27/2009)  Labs Reviewed: K+: 4.7 (11/06/2009) Creat: : 0.6 (11/06/2009)   Chol: 186 (09/17/2009)   HDL: 63 (09/17/2009)   LDL: 95 (09/17/2009)   TG: 139 (09/17/2009)  Complete Medication List: 1)  Creon 24000 Unit Cpep (Pancrelipase (lip-prot-amyl)) .Marland Kitchen.. 1 capsule three times a day with meals as needed 2)  Azor 5-20 Mg Tabs (Amlodipine-olmesartan) .... Take 1 tablet by mouth once a day 3)  Evista 60 Mg Tabs (Raloxifene hcl) .... Take 1 tablet by mouth once a day 4)  Triamcinolone Acetonide 0.5 % Crea (Triamcinolone acetonide) .... Apply two times a day  Patient Instructions: 1)  Please schedule a follow-up appointment in 6 months. 2)  BMP prior to visit, ICD-9: 401.9 3)  High sensitivity CRP :  272.4 4)  HbgA1C prior to visit, ICD-9: 790.29 5)  TSH prior to visit, ICD-9:  401.9 6)  Please return for lab work one (1) week before  your next appointment.  Prescriptions: EVISTA 60 MG TABS (RALOXIFENE HCL) Take 1 tablet by mouth once a day  #90 x 3   Entered and Authorized by:   D. Thomos Lemons DO   Signed by:   D. Thomos Lemons DO on 02/04/2010   Method used:   Print then Give to Patient   RxID:   [id number] AZOR 5-20 MG TABS (AMLODIPINE-OLMESARTAN) Take 1 tablet by mouth once a day  #90 x 3   Entered and Authorized by:   D. Thomos Lemons DO   Signed by:   D. Thomos Lemons DO on 02/04/2010   Method used:   Print then Give to Patient   RxID:   [id number]    Orders Added: 1)  Est. Patient Level III [95284]     Current Allergies (reviewed today): No known allergies

## 2010-03-18 ENCOUNTER — Ambulatory Visit: Payer: Self-pay | Admitting: Internal Medicine

## 2010-05-01 ENCOUNTER — Encounter: Payer: Self-pay | Admitting: Internal Medicine

## 2010-05-03 ENCOUNTER — Ambulatory Visit: Payer: Medicare Other | Admitting: Internal Medicine

## 2010-05-25 ENCOUNTER — Emergency Department (HOSPITAL_BASED_OUTPATIENT_CLINIC_OR_DEPARTMENT_OTHER)
Admission: EM | Admit: 2010-05-25 | Discharge: 2010-05-25 | Disposition: A | Discharge status: Home / Self Care | Payer: Medicare Other | Attending: Emergency Medicine | Admitting: Emergency Medicine

## 2010-05-25 ENCOUNTER — Emergency Department (INDEPENDENT_AMBULATORY_CARE_PROVIDER_SITE_OTHER): Payer: Medicare Other

## 2010-05-25 DIAGNOSIS — R071 Chest pain on breathing: Secondary | ICD-10-CM

## 2010-05-25 DIAGNOSIS — W2209XA Striking against other stationary object, initial encounter: Secondary | ICD-10-CM

## 2010-05-25 DIAGNOSIS — M810 Age-related osteoporosis without current pathological fracture: Secondary | ICD-10-CM | POA: Insufficient documentation

## 2010-05-25 DIAGNOSIS — Y92009 Unspecified place in unspecified non-institutional (private) residence as the place of occurrence of the external cause: Secondary | ICD-10-CM | POA: Insufficient documentation

## 2010-05-25 DIAGNOSIS — I10 Essential (primary) hypertension: Secondary | ICD-10-CM | POA: Insufficient documentation

## 2010-05-25 DIAGNOSIS — S20219A Contusion of unspecified front wall of thorax, initial encounter: Secondary | ICD-10-CM | POA: Insufficient documentation

## 2010-05-27 ENCOUNTER — Ambulatory Visit (HOSPITAL_BASED_OUTPATIENT_CLINIC_OR_DEPARTMENT_OTHER)
Admission: RE | Admit: 2010-05-27 | Discharge: 2010-05-27 | Disposition: A | Discharge status: Home / Self Care | Payer: Medicare Other | Source: Ambulatory Visit | Attending: Internal Medicine | Admitting: Internal Medicine

## 2010-05-27 ENCOUNTER — Other Ambulatory Visit: Payer: Self-pay | Admitting: Internal Medicine

## 2010-05-27 ENCOUNTER — Ambulatory Visit (HOSPITAL_BASED_OUTPATIENT_CLINIC_OR_DEPARTMENT_OTHER): Payer: Self-pay

## 2010-05-27 DIAGNOSIS — Z1231 Encounter for screening mammogram for malignant neoplasm of breast: Secondary | ICD-10-CM | POA: Insufficient documentation

## 2010-06-17 ENCOUNTER — Telehealth: Payer: Self-pay | Admitting: *Deleted

## 2010-06-17 ENCOUNTER — Other Ambulatory Visit: Payer: Self-pay | Admitting: *Deleted

## 2010-06-17 DIAGNOSIS — I10 Essential (primary) hypertension: Secondary | ICD-10-CM

## 2010-06-17 DIAGNOSIS — E785 Hyperlipidemia, unspecified: Secondary | ICD-10-CM

## 2010-06-17 DIAGNOSIS — R7309 Other abnormal glucose: Secondary | ICD-10-CM

## 2010-06-17 LAB — BASIC METABOLIC PANEL
BUN: 16 mg/dL (ref 6–23)
CO2: 26 mEq/L (ref 19–32)
Chloride: 106 mEq/L (ref 96–112)
Potassium: 4.6 mEq/L (ref 3.5–5.3)

## 2010-06-17 LAB — HEMOGLOBIN A1C: Mean Plasma Glucose: 126 mg/dL — ABNORMAL HIGH (ref <117)

## 2010-06-17 NOTE — Telephone Encounter (Signed)
Call placed to patient at [phone], she was informed that her insurance will not cover the high sensitivity crp, and wanted to know if she wanted to have the test done. She stated that she has never had a problem with her cholesterol. She was informed CRP was use to evaluated inflammation. She has declined to have the test done, and will follow up at her office visit to discuss the need for the test.

## 2010-06-25 ENCOUNTER — Ambulatory Visit: Payer: Medicare Other | Admitting: Internal Medicine

## 2010-07-22 ENCOUNTER — Encounter: Payer: Self-pay | Admitting: Internal Medicine

## 2010-07-22 ENCOUNTER — Ambulatory Visit (INDEPENDENT_AMBULATORY_CARE_PROVIDER_SITE_OTHER): Payer: Medicare Other | Admitting: Internal Medicine

## 2010-07-22 ENCOUNTER — Other Ambulatory Visit: Payer: Self-pay | Admitting: Internal Medicine

## 2010-07-22 VITALS — BP 126/68 | HR 61 | Temp 98.8°F | Ht 62.0 in | Wt 155.0 lb

## 2010-07-22 DIAGNOSIS — K120 Recurrent oral aphthae: Secondary | ICD-10-CM

## 2010-07-22 MED ORDER — RALOXIFENE HCL 60 MG PO TABS: 60.0000 mg | ORAL_TABLET | Freq: Every day | ORAL | Status: DC

## 2010-07-22 MED ORDER — AMLODIPINE-OLMESARTAN 5-20 MG PO TABS: 1.0000 | ORAL_TABLET | Freq: Every day | ORAL | Status: DC

## 2010-07-22 MED ORDER — BETAMETHASONE VALERATE 0.1 % EX CREA: TOPICAL_CREAM | Freq: Two times a day (BID) | CUTANEOUS | Status: DC

## 2010-07-22 MED ORDER — VALACYCLOVIR HCL 1 G PO TABS: ORAL_TABLET | ORAL | Status: AC

## 2010-07-22 NOTE — Assessment & Plan Note (Signed)
Obtain ANA given persistence of sx's and lack of associated trigger. Attempt short course of valtrex. Continue otc sx relief prn.

## 2010-07-22 NOTE — Progress Notes (Signed)
  Subjective:    Patient ID: Patricia Hansen, female    DOB: 10-01-1940, 70 y.o.   MRN: [id number]  HPI  Pt presents to clinic for evaluation of oral ulcers. Several month h/o oral ulcers diffusely noted involving bilateral lateral tongue borders and oral mucosa. Has attempted otc benzocaine and hydrogen peroxide without significant improvement. No ocular involvment. No known autoimmune hx. Denies unexplained arthalgias or rashes. No alleviating or exacerbating factors. No recent illness/infection. No other complaints.  Reviewed pmh, medications and allergies    Review of Systems  Constitutional: Negative for fever and chills.  HENT: Positive for mouth sores.   Musculoskeletal: Negative for arthralgias.  Skin: Negative for rash.       Objective:   Physical Exam  Nursing note and vitals reviewed. Constitutional: She appears well-developed and well-nourished. No distress.  HENT:  Head: Normocephalic and atraumatic.  Right Ear: External ear normal.  Left Ear: External ear normal.  Nose: Nose normal.  Mouth/Throat: Oropharynx is clear and moist. No oropharyngeal exudate.       Multiple in excess of ten small (~1-52mm) oral ulcers including lateral tongue borders.  Eyes: Conjunctivae are normal. No scleral icterus.  Neurological: She is alert.  Skin: Skin is warm and dry. She is not diaphoretic.  Psychiatric: She has a normal mood and affect.          Assessment & Plan:

## 2010-08-14 ENCOUNTER — Telehealth: Payer: Self-pay | Admitting: Internal Medicine

## 2010-08-14 NOTE — Telephone Encounter (Signed)
Please mail a printed copy of her most recent lab results.

## 2010-08-14 NOTE — Telephone Encounter (Signed)
Copy of lab results from 06/17/2010 and 07/23/2010 mailed to patients address on file

## 2010-08-21 ENCOUNTER — Ambulatory Visit (INDEPENDENT_AMBULATORY_CARE_PROVIDER_SITE_OTHER): Payer: Medicare Other | Admitting: Internal Medicine

## 2010-08-21 ENCOUNTER — Encounter: Payer: Self-pay | Admitting: Internal Medicine

## 2010-08-21 DIAGNOSIS — K120 Recurrent oral aphthae: Secondary | ICD-10-CM

## 2010-08-21 DIAGNOSIS — S93401A Sprain of unspecified ligament of right ankle, initial encounter: Secondary | ICD-10-CM | POA: Insufficient documentation

## 2010-08-21 DIAGNOSIS — S93409A Sprain of unspecified ligament of unspecified ankle, initial encounter: Secondary | ICD-10-CM

## 2010-08-21 MED ORDER — VALACYCLOVIR HCL 1 G PO TABS: 1000.0000 mg | ORAL_TABLET | Freq: Every day | ORAL | Status: DC

## 2010-08-21 NOTE — Assessment & Plan Note (Signed)
ANA was negative. Unknown trigger. Refill for valtrex provided for future flares.

## 2010-08-21 NOTE — Progress Notes (Signed)
  Subjective:    Patient ID: Patricia Hansen, female    DOB: 06/29/1940, 70 y.o.   MRN: [id number]  HPI 70 y/o female complains of right ankle injury.  She fell out golf cart today landing on right side.  She did not initially experience pain but later develop edema and pain with weight bearing (worse when she is wearing her shoes).  No other injury reported.  Int hx: She has severe case of aphthous oral ulcers in July 2012.  Finally responded to course of valtrex.  No known trigger   Htn - stable   Review of Systems  Past Medical History  Diagnosis Date  . Osteopenia   . Intolerance of drug     biophosphates  . Dyspepsia     chronic s/p EGD negative 2009  . History of Helicobacter infection     treated x 2  . Hx of colonoscopy 06/2005    no polyps found, next colonoscopy 2017  . Hypertension     History   Social History  . Marital Status: Married    Spouse Name: N/A    Number of Children: N/A  . Years of Education: N/A   Occupational History  . Not on file.   Social History Main Topics  . Smoking status: Never Smoker   . Smokeless tobacco: Never Used  . Alcohol Use: Not on file  . Drug Use: Not on file  . Sexually Active: Not on file   Other Topics Concern  . Not on file   Social History Narrative   RetiredMarried > 40 yrs 4 daughters Never Smoked    Alcohol use-no       Past Surgical History  Procedure Date  . Breast lumpectomy 1980    Family History  Problem Relation Age of Onset  . Uterine cancer Mother     deceased  . Hypertension Father     deceased  . Other      no history of breast cancer, diabetes or colon cancer    No Known Allergies  Current Outpatient Prescriptions on File Prior to Visit  Medication Sig Dispense Refill  . amLODipine-olmesartan (AZOR) 5-20 MG per tablet Take 1 tablet by mouth daily.  90 tablet  2  . betamethasone valerate (VALISONE) 0.1 % cream Apply topically 2 (two) times daily.  30 g  4  . raloxifene (EVISTA) 60 MG  tablet Take 1 tablet (60 mg total) by mouth daily.  90 tablet  2  . Pancrelipase, Lip-Prot-Amyl, (CREON) 24000 UNITS CPEP Take by mouth. One capsule three times a day with meals as needed         BP 126/82  Temp(Src) 98.3 F (36.8 C) (Oral)  Wt 161 lb (73.029 kg)       Objective:   Physical Exam   Constitutional: Appears well-developed and well-nourished. No distress.  Cardiovascular: Normal rate, regular rhythm and normal heart sounds.  Exam reveals no gallop and no friction rub.   No murmur heard. Pulmonary/Chest: Effort normal and breath sounds normal.  No wheezes. No rales.  Ext:  Mild right ankle and calf swelling.  No calf tenderness.  Ankle mortise stable.   No lateral laxity  Assessment & Plan:

## 2010-08-21 NOTE — Assessment & Plan Note (Signed)
70 y/o female with right ankle sprain after falling out of golf cart Rest, ice, compression x 24-48 hours with elevation Patient advised to call office if symptoms persist or worsen.

## 2010-08-21 NOTE — Patient Instructions (Signed)
Use ACE bandage with ice 2 x per day for next 24 - 48 hrs

## 2010-09-17 ENCOUNTER — Ambulatory Visit: Payer: Medicare Other | Admitting: Internal Medicine

## 2010-10-16 ENCOUNTER — Ambulatory Visit: Payer: Medicare Other

## 2010-10-16 ENCOUNTER — Ambulatory Visit (INDEPENDENT_AMBULATORY_CARE_PROVIDER_SITE_OTHER): Payer: Medicare Other | Admitting: Family

## 2010-10-16 DIAGNOSIS — Z23 Encounter for immunization: Secondary | ICD-10-CM

## 2011-05-08 DIAGNOSIS — Z13820 Encounter for screening for osteoporosis: Secondary | ICD-10-CM | POA: Diagnosis not present

## 2011-05-29 DIAGNOSIS — Z1231 Encounter for screening mammogram for malignant neoplasm of breast: Secondary | ICD-10-CM | POA: Diagnosis not present

## 2011-06-06 DIAGNOSIS — N816 Rectocele: Secondary | ICD-10-CM | POA: Diagnosis not present

## 2011-06-06 DIAGNOSIS — M949 Disorder of cartilage, unspecified: Secondary | ICD-10-CM | POA: Diagnosis not present

## 2011-06-06 DIAGNOSIS — N8112 Cystocele, lateral: Secondary | ICD-10-CM | POA: Diagnosis not present

## 2011-06-06 DIAGNOSIS — Z124 Encounter for screening for malignant neoplasm of cervix: Secondary | ICD-10-CM | POA: Diagnosis not present

## 2011-06-06 DIAGNOSIS — M899 Disorder of bone, unspecified: Secondary | ICD-10-CM | POA: Diagnosis not present

## 2011-06-11 DIAGNOSIS — Z1212 Encounter for screening for malignant neoplasm of rectum: Secondary | ICD-10-CM | POA: Diagnosis not present

## 2011-06-17 DIAGNOSIS — M899 Disorder of bone, unspecified: Secondary | ICD-10-CM | POA: Diagnosis not present

## 2011-06-17 DIAGNOSIS — M949 Disorder of cartilage, unspecified: Secondary | ICD-10-CM | POA: Diagnosis not present

## 2011-06-17 DIAGNOSIS — M810 Age-related osteoporosis without current pathological fracture: Secondary | ICD-10-CM | POA: Diagnosis not present

## 2011-06-18 DIAGNOSIS — M899 Disorder of bone, unspecified: Secondary | ICD-10-CM | POA: Diagnosis not present

## 2011-07-03 ENCOUNTER — Ambulatory Visit (INDEPENDENT_AMBULATORY_CARE_PROVIDER_SITE_OTHER): Payer: Medicare Other | Admitting: Internal Medicine

## 2011-07-03 ENCOUNTER — Encounter: Payer: Self-pay | Admitting: Internal Medicine

## 2011-07-03 VITALS — BP 104/66 | HR 76 | Temp 98.3°F | Ht 61.0 in | Wt 160.0 lb

## 2011-07-03 DIAGNOSIS — K861 Other chronic pancreatitis: Secondary | ICD-10-CM | POA: Diagnosis not present

## 2011-07-03 DIAGNOSIS — R1013 Epigastric pain: Secondary | ICD-10-CM | POA: Diagnosis not present

## 2011-07-03 DIAGNOSIS — K120 Recurrent oral aphthae: Secondary | ICD-10-CM

## 2011-07-03 DIAGNOSIS — I10 Essential (primary) hypertension: Secondary | ICD-10-CM | POA: Diagnosis not present

## 2011-07-03 DIAGNOSIS — H538 Other visual disturbances: Secondary | ICD-10-CM | POA: Diagnosis not present

## 2011-07-03 DIAGNOSIS — M810 Age-related osteoporosis without current pathological fracture: Secondary | ICD-10-CM

## 2011-07-03 LAB — POCT URINALYSIS DIPSTICK
Bilirubin, UA: NEGATIVE
Glucose, UA: NEGATIVE
Spec Grav, UA: >=1.03
Urobilinogen, UA: 0.2

## 2011-07-03 LAB — LIPID PANEL
HDL: 71.6 mg/dL (ref >39.00)
Triglycerides: 93 mg/dL (ref 0.0–149.0)
VLDL: 18.6 mg/dL (ref 0.0–40.0)

## 2011-07-03 LAB — BASIC METABOLIC PANEL
Chloride: 106 mEq/L (ref 96–112)
GFR: 91.06 mL/min (ref >60.00)
Glucose, Bld: 87 mg/dL (ref 70–99)
Potassium: 4.4 mEq/L (ref 3.5–5.1)
Sodium: 141 mEq/L (ref 135–145)

## 2011-07-03 LAB — HEPATIC FUNCTION PANEL
ALT: 15 U/L (ref 0–35)
Total Bilirubin: 0.4 mg/dL (ref 0.3–1.2)
Total Protein: 6.6 g/dL (ref 6.0–8.3)

## 2011-07-03 LAB — LDL CHOLESTEROL, DIRECT: Direct LDL: 118.5 mg/dL

## 2011-07-03 LAB — CBC WITH DIFFERENTIAL/PLATELET
Basophils Relative: 0.4 % (ref 0.0–3.0)
Eosinophils Relative: 0.6 % (ref 0.0–5.0)
Lymphocytes Relative: 35.2 % (ref 12.0–46.0)
Neutrophils Relative %: 58.2 % (ref 43.0–77.0)
RBC: 3.87 Mil/uL (ref 3.87–5.11)
WBC: 5 10*3/uL (ref 4.5–10.5)

## 2011-07-03 LAB — TSH: TSH: 2.3 u[IU]/mL (ref 0.35–5.50)

## 2011-07-03 MED ORDER — PANCRELIPASE (LIP-PROT-AMYL) 24000-76000 UNITS PO CPEP: ORAL_CAPSULE | ORAL | Status: DC

## 2011-07-03 MED ORDER — VALACYCLOVIR HCL 1 G PO TABS: 1000.0000 mg | ORAL_TABLET | Freq: Every day | ORAL | Status: DC

## 2011-07-03 MED ORDER — BETAMETHASONE VALERATE 0.1 % EX CREA: TOPICAL_CREAM | Freq: Two times a day (BID) | CUTANEOUS | Status: DC

## 2011-07-03 MED ORDER — AMLODIPINE-OLMESARTAN 5-20 MG PO TABS: 1.0000 | ORAL_TABLET | Freq: Every day | ORAL | Status: DC

## 2011-07-03 NOTE — Progress Notes (Signed)
Subjective:    Patient ID: Patricia Hansen, female    DOB: [birth year], 71 y.o.   MRN: [id number]  HPI  71 year old Asian female with history of hypertension, osteoporosis, and aphthous ulcers for routine followup. Patient was seen by endocrinologist at [facility]. Her DEXA scan showed her bone dust density has worsened. Her endocrinologist recommended ReClast infusion. She is reluctant to initiate this therapy. She had reaction to oral bisphosphonates. She reports experiencing generalized aches and pains as well as lower extremity swelling. Her vitamin D level is normal. She was instructed to increase her calcium intake to at least 1000 mg daily.  She walks daily.  Hypertension-stable. She denies any lightheadedness. Good medication compliance.   Patient still having periodic flares of aphthous ulcers. She used Valtrex 2 - 3 weeks ago.  She complains of poor night vision. She requests referral to ophthalmologist.   Review of Systems  negative for chest pain or shortness of breath  Past Medical History  Diagnosis Date  . Osteopenia   . Intolerance of drug     biophosphates  . Dyspepsia     chronic s/p EGD negative 2009  . History of Helicobacter infection     treated x 2  . Hx of colonoscopy 06/2005    no polyps found, next colonoscopy 2017  . Hypertension     History   Social History  . Marital Status: Married    Spouse Name: N/A    Number of Children: N/A  . Years of Education: N/A   Occupational History  . Not on file.   Social History Main Topics  . Smoking status: Never Smoker   . Smokeless tobacco: Never Used  . Alcohol Use: Not on file  . Drug Use: Not on file  . Sexually Active: Not on file   Other Topics Concern  . Not on file   Social History Narrative   RetiredMarried > 40 yrs 4 daughters Never Smoked    Alcohol use-no       Past Surgical History  Procedure Date  . Breast lumpectomy 1980    Family History  Problem Relation Age of Onset  . Uterine  cancer Mother     deceased  . Hypertension Father     deceased  . Other      no history of breast cancer, diabetes or colon cancer    No Known Allergies  Current Outpatient Prescriptions on File Prior to Visit  Medication Sig Dispense Refill  . raloxifene (EVISTA) 60 MG tablet Take 1 tablet (60 mg total) by mouth daily.  90 tablet  2  . DISCONTD: amLODipine-olmesartan (AZOR) 5-20 MG per tablet Take 1 tablet by mouth daily.  90 tablet  2    BP 104/66  Pulse 76  Temp 98.3 F (36.8 C) (Oral)  Ht 5\' 1"  (1.549 m)  Wt 160 lb (72.576 kg)  BMI 30.23 kg/m2       Objective:   Physical Exam  Constitutional: She is oriented to person, place, and time. She appears well-developed and well-nourished.  HENT:  Right Ear: External ear normal.  Left Ear: External ear normal.  Mouth/Throat: Oropharynx is clear and moist.  Eyes:       Bilateral arcus senilis  Neck: Neck supple.  Cardiovascular: Normal rate, regular rhythm and normal heart sounds.   Pulmonary/Chest: Effort normal. She has no wheezes.  Abdominal: Soft. Bowel sounds are normal. There is no tenderness.  Musculoskeletal: She exhibits no edema.  Lymphadenopathy:    She has  no cervical adenopathy.  Neurological: She is alert and oriented to person, place, and time. No cranial nerve deficit.  Skin: Skin is warm and dry.  Psychiatric: She has a normal mood and affect. Her behavior is normal.       Assessment & Plan:

## 2011-07-03 NOTE — Assessment & Plan Note (Signed)
Refer to Dr. Burgess Estelle for evaluation.  She may have cataracts.

## 2011-07-03 NOTE — Assessment & Plan Note (Signed)
Use valtrex as needed for intermittent flares.

## 2011-07-03 NOTE — Assessment & Plan Note (Signed)
Patient recently seen by her endocrinologist at [facility]. Her DEXA scan has worsened and he is recommending IV bisphosphonate. She had reaction to oral bisphosphonates in the past (arthralgias and lower extremity edema). I suggest Prolia injections q. 6 months. Continue calcium and vitamin D supplementation.

## 2011-07-03 NOTE — Assessment & Plan Note (Signed)
Well controlled on Azor.  Monitor electrolytes and kidney function.

## 2011-07-03 NOTE — Patient Instructions (Addendum)
Take Oscal 500 mg twice daily Our office will contact you re: ophthalmology referral We will also contact you regarding Prolia injections

## 2011-07-04 ENCOUNTER — Encounter: Payer: Self-pay | Admitting: Internal Medicine

## 2011-07-07 ENCOUNTER — Telehealth: Payer: Self-pay | Admitting: Internal Medicine

## 2011-07-07 DIAGNOSIS — R21 Rash and other nonspecific skin eruption: Secondary | ICD-10-CM

## 2011-07-07 NOTE — Telephone Encounter (Signed)
Refer to Dr. Karlyn Agee of [facility].

## 2011-07-07 NOTE — Telephone Encounter (Signed)
Referral order placed.

## 2011-07-07 NOTE — Telephone Encounter (Signed)
Pt req to get referral to good dermatologist re: skin problems. Pt said she has already seen Dr Artist Pais re: this matter.

## 2011-07-08 ENCOUNTER — Ambulatory Visit (INDEPENDENT_AMBULATORY_CARE_PROVIDER_SITE_OTHER): Payer: Medicare Other | Admitting: Internal Medicine

## 2011-07-08 ENCOUNTER — Encounter: Payer: Self-pay | Admitting: Internal Medicine

## 2011-07-08 VITALS — BP 122/68 | Temp 98.4°F | Wt 162.0 lb

## 2011-07-08 DIAGNOSIS — L259 Unspecified contact dermatitis, unspecified cause: Secondary | ICD-10-CM

## 2011-07-08 DIAGNOSIS — L239 Allergic contact dermatitis, unspecified cause: Secondary | ICD-10-CM | POA: Insufficient documentation

## 2011-07-08 MED ORDER — CLOBETASOL PROPIONATE 0.05 % EX CREA: TOPICAL_CREAM | Freq: Two times a day (BID) | CUTANEOUS | Status: AC

## 2011-07-08 MED ORDER — CLOBETASOL PROPIONATE 0.05 % EX CREA: TOPICAL_CREAM | Freq: Two times a day (BID) | CUTANEOUS | Status: DC

## 2011-07-08 NOTE — Assessment & Plan Note (Signed)
Patient likely has allergic dermatitis from plant exposure. Use clobetasol cream as directed. Patient advised to call office if symptoms persist or worsen.

## 2011-07-08 NOTE — Patient Instructions (Addendum)
Please call our office if your symptoms do not improve or gets worse.  

## 2011-07-08 NOTE — Progress Notes (Signed)
  Subjective:    Patient ID: Patricia Hansen, female    DOB: [birth year], 71 y.o.   MRN: [id number]  Rash This is a new problem. The current episode started 1 to 4 weeks ago. The problem is unchanged. The affected locations include the right upper leg, right lower leg, left upper leg and left lower leg. The rash is characterized by redness and itchiness. It is unknown if there was an exposure to a precipitant. Pertinent negatives include no fever or joint pain. Past treatments include anti-itch cream. The treatment provided no relief.   She golfs several times per week and works in her garden daily.    Review of Systems  Constitutional: Negative for fever.  Musculoskeletal: Negative for joint pain.  Skin: Positive for rash.   Past Medical History  Diagnosis Date  . Osteopenia   . Intolerance of drug     biophosphates  . Dyspepsia     chronic s/p EGD negative 2009  . History of Helicobacter infection     treated x 2  . Hx of colonoscopy 06/2005    no polyps found, next colonoscopy 2017  . Hypertension     History   Social History  . Marital Status: Married    Spouse Name: N/A    Number of Children: N/A  . Years of Education: N/A   Occupational History  . Not on file.   Social History Main Topics  . Smoking status: Never Smoker   . Smokeless tobacco: Never Used  . Alcohol Use: Not on file  . Drug Use: Not on file  . Sexually Active: Not on file   Other Topics Concern  . Not on file   Social History Narrative   RetiredMarried > 40 yrs 4 daughters Never Smoked    Alcohol use-no       Past Surgical History  Procedure Date  . Breast lumpectomy 1980    Family History  Problem Relation Age of Onset  . Uterine cancer Mother     deceased  . Hypertension Father     deceased  . Other      no history of breast cancer, diabetes or colon cancer    No Known Allergies  Current Outpatient Prescriptions on File Prior to Visit  Medication Sig Dispense Refill  .  amLODipine-olmesartan (AZOR) 5-20 MG per tablet Take 1 tablet by mouth daily.  90 tablet  3  . Pancrelipase, Lip-Prot-Amyl, (CREON) 24000 UNITS CPEP One capsule three times a day with meals as needed  270 capsule  3  . raloxifene (EVISTA) 60 MG tablet Take 1 tablet (60 mg total) by mouth daily.  90 tablet  2  . valACYclovir (VALTREX) 1000 MG tablet Take 1 tablet (1,000 mg total) by mouth daily.  10 tablet  11    BP 122/68  Temp 98.4 F (36.9 C) (Oral)  Wt 162 lb (73.483 kg)       Objective:   Physical Exam  Constitutional: She appears well-developed and well-nourished.  Cardiovascular: Normal rate, regular rhythm and normal heart sounds.   Pulmonary/Chest: Effort normal and breath sounds normal. She has no wheezes.  Skin:       Erythematous macular patch behind right thigh,  smaller area on right calf area          Assessment & Plan:

## 2011-07-17 DIAGNOSIS — J029 Acute pharyngitis, unspecified: Secondary | ICD-10-CM | POA: Diagnosis not present

## 2011-07-31 ENCOUNTER — Telehealth: Payer: Self-pay | Admitting: Family Medicine

## 2011-07-31 NOTE — Telephone Encounter (Signed)
Yes. I can call patient and ask if she would like to do the injection. If so, I will schedule.

## 2011-07-31 NOTE — Telephone Encounter (Signed)
So is pt going to proceed.  Do we give injections in the office?

## 2011-07-31 NOTE — Telephone Encounter (Signed)
The patient has coverage. She must meet a $147 deductible, and it's likely she has, but Prolia did not specify. The shot itself is only like $90, so even if she has to pay 20% of that, which seems to be the standard, that's approx. $18.

## 2011-07-31 NOTE — Telephone Encounter (Signed)
Pt scheduled for tomorrow afternoon.

## 2011-08-01 ENCOUNTER — Ambulatory Visit (INDEPENDENT_AMBULATORY_CARE_PROVIDER_SITE_OTHER): Payer: Medicare Other | Admitting: Internal Medicine

## 2011-08-01 DIAGNOSIS — H52209 Unspecified astigmatism, unspecified eye: Secondary | ICD-10-CM | POA: Diagnosis not present

## 2011-08-01 DIAGNOSIS — M810 Age-related osteoporosis without current pathological fracture: Secondary | ICD-10-CM

## 2011-08-01 DIAGNOSIS — H2510 Age-related nuclear cataract, unspecified eye: Secondary | ICD-10-CM | POA: Diagnosis not present

## 2011-08-01 DIAGNOSIS — H179 Unspecified corneal scar and opacity: Secondary | ICD-10-CM | POA: Diagnosis not present

## 2011-08-01 DIAGNOSIS — H43819 Vitreous degeneration, unspecified eye: Secondary | ICD-10-CM | POA: Diagnosis not present

## 2011-08-01 MED ORDER — DENOSUMAB 60 MG/ML ~~LOC~~ SOLN
60.0000 mg | Freq: Once | SUBCUTANEOUS | Status: AC
  Administered 2011-08-01: 60 mg via SUBCUTANEOUS

## 2011-09-22 ENCOUNTER — Ambulatory Visit (INDEPENDENT_AMBULATORY_CARE_PROVIDER_SITE_OTHER): Payer: Medicare Other

## 2011-09-22 DIAGNOSIS — Z23 Encounter for immunization: Secondary | ICD-10-CM

## 2011-09-24 ENCOUNTER — Ambulatory Visit: Payer: Medicare Other | Admitting: Internal Medicine

## 2012-01-09 ENCOUNTER — Other Ambulatory Visit: Payer: Self-pay | Admitting: *Deleted

## 2012-01-09 MED ORDER — AMLODIPINE-OLMESARTAN 10-40 MG PO TABS: 1.0000 | ORAL_TABLET | Freq: Every day | ORAL | Status: DC

## 2012-01-12 ENCOUNTER — Ambulatory Visit: Payer: Medicare Other | Admitting: Internal Medicine

## 2012-01-15 ENCOUNTER — Ambulatory Visit: Payer: Medicare Other | Admitting: Internal Medicine

## 2012-02-02 ENCOUNTER — Ambulatory Visit: Payer: Medicare Other | Admitting: Internal Medicine

## 2012-02-02 ENCOUNTER — Encounter: Payer: Self-pay | Admitting: Internal Medicine

## 2012-02-02 ENCOUNTER — Ambulatory Visit (INDEPENDENT_AMBULATORY_CARE_PROVIDER_SITE_OTHER): Payer: Medicare Other | Admitting: Internal Medicine

## 2012-02-02 VITALS — BP 124/70 | HR 72 | Temp 98.1°F | Wt 165.0 lb

## 2012-02-02 DIAGNOSIS — I10 Essential (primary) hypertension: Secondary | ICD-10-CM | POA: Diagnosis not present

## 2012-02-02 DIAGNOSIS — M810 Age-related osteoporosis without current pathological fracture: Secondary | ICD-10-CM | POA: Diagnosis not present

## 2012-02-02 LAB — BASIC METABOLIC PANEL
CO2: 28 mEq/L (ref 19–32)
Calcium: 8.8 mg/dL (ref 8.4–10.5)
Chloride: 108 mEq/L (ref 96–112)
Sodium: 141 mEq/L (ref 135–145)

## 2012-02-02 MED ORDER — DENOSUMAB 60 MG/ML ~~LOC~~ SOLN
60.0000 mg | Freq: Once | SUBCUTANEOUS | Status: AC
  Administered 2012-02-02: 60 mg via SUBCUTANEOUS

## 2012-02-02 NOTE — Assessment & Plan Note (Signed)
Patient tolerating Prolia.  Continue q 6 months.  Patient encouraged to take Os-Cal twice daily with meals.

## 2012-02-02 NOTE — Progress Notes (Signed)
  Subjective:    Patient ID: Patricia Hansen, female    DOB: 1940/11/30, 72 y.o.   MRN: [id number]  HPI  72 year old [name] with history of hypertension and osteoporosis for followup. She is tolerating Prolia injections without difficulty. She discontinued Evista.  She is not taking any calcium supplementation.  Hypertension-she tried discontinuing her blood pressure medications on her own.  She resumed antihypertensives when she noticed a rise in blood pressure.  Review of Systems Negative for chest pain or shortness of breath  Past Medical History  Diagnosis Date  . Osteopenia   . Intolerance of drug     biophosphates  . Dyspepsia     chronic s/p EGD negative 2009  . History of Helicobacter infection     treated x 2  . Hx of colonoscopy 06/2005    no polyps found, next colonoscopy 2017  . Hypertension     History   Social History  . Marital Status: Married    Spouse Name: N/A    Number of Children: N/A  . Years of Education: N/A   Occupational History  . Not on file.   Social History Main Topics  . Smoking status: Never Smoker   . Smokeless tobacco: Never Used  . Alcohol Use: Not on file  . Drug Use: Not on file  . Sexually Active: Not on file   Other Topics Concern  . Not on file   Social History Narrative   RetiredMarried > 40 yrs 4 daughters Never Smoked    Alcohol use-no       Past Surgical History  Procedure Date  . Breast lumpectomy 1980    Family History  Problem Relation Age of Onset  . Uterine cancer Mother     deceased  . Hypertension Father     deceased  . Other      no history of breast cancer, diabetes or colon cancer    No Known Allergies  Current Outpatient Prescriptions on File Prior to Visit  Medication Sig Dispense Refill  . amLODipine-olmesartan (AZOR) 10-40 MG per tablet Take 1 tablet by mouth daily.  90 tablet  3  . clobetasol cream (TEMOVATE) 0.05 % Apply topically 2 (two) times daily.  30 g  2  . Pancrelipase,  Lip-Prot-Amyl, (CREON) 24000 UNITS CPEP One capsule three times a day with meals as needed  270 capsule  3  . raloxifene (EVISTA) 60 MG tablet Take 1 tablet (60 mg total) by mouth daily.  90 tablet  2    BP 124/70  Pulse 72  Temp 98.1 F (36.7 C) (Oral)  Wt 165 lb (74.844 kg)       Objective:   Physical Exam  Constitutional: She is oriented to person, place, and time. She appears well-developed and well-nourished.  Cardiovascular: Normal rate, regular rhythm and normal heart sounds.   Pulmonary/Chest: Effort normal and breath sounds normal. She has no wheezes.  Musculoskeletal: She exhibits no edema.  Neurological: She is alert and oriented to person, place, and time. No cranial nerve deficit.  Psychiatric: She has a normal mood and affect. Her behavior is normal.          Assessment & Plan:

## 2012-02-02 NOTE — Patient Instructions (Addendum)
Please take OsCal twice daily

## 2012-02-02 NOTE — Assessment & Plan Note (Signed)
Well controlled.  Continue Azor.  BP: 124/70 mmHg  Monitor electrolytes and kidney function.

## 2012-06-07 DIAGNOSIS — Z1231 Encounter for screening mammogram for malignant neoplasm of breast: Secondary | ICD-10-CM | POA: Diagnosis not present

## 2012-07-14 DIAGNOSIS — M810 Age-related osteoporosis without current pathological fracture: Secondary | ICD-10-CM | POA: Diagnosis not present

## 2012-07-14 DIAGNOSIS — N952 Postmenopausal atrophic vaginitis: Secondary | ICD-10-CM | POA: Diagnosis not present

## 2012-07-14 DIAGNOSIS — Z124 Encounter for screening for malignant neoplasm of cervix: Secondary | ICD-10-CM | POA: Diagnosis not present

## 2012-07-29 DIAGNOSIS — M810 Age-related osteoporosis without current pathological fracture: Secondary | ICD-10-CM | POA: Diagnosis not present

## 2012-07-29 DIAGNOSIS — J029 Acute pharyngitis, unspecified: Secondary | ICD-10-CM | POA: Diagnosis not present

## 2012-07-29 DIAGNOSIS — B009 Herpesviral infection, unspecified: Secondary | ICD-10-CM | POA: Diagnosis not present

## 2012-07-29 DIAGNOSIS — I10 Essential (primary) hypertension: Secondary | ICD-10-CM | POA: Diagnosis not present

## 2012-08-02 ENCOUNTER — Ambulatory Visit: Payer: Medicare Other | Admitting: Internal Medicine

## 2012-08-02 ENCOUNTER — Ambulatory Visit (INDEPENDENT_AMBULATORY_CARE_PROVIDER_SITE_OTHER): Payer: Medicare Other | Admitting: Internal Medicine

## 2012-08-02 DIAGNOSIS — M810 Age-related osteoporosis without current pathological fracture: Secondary | ICD-10-CM | POA: Diagnosis not present

## 2012-08-02 MED ORDER — DENOSUMAB 60 MG/ML ~~LOC~~ SOLN
60.0000 mg | Freq: Once | SUBCUTANEOUS | Status: AC
  Administered 2012-08-02: 60 mg via SUBCUTANEOUS

## 2012-08-04 DIAGNOSIS — H902 Conductive hearing loss, unspecified: Secondary | ICD-10-CM | POA: Diagnosis not present

## 2012-08-04 DIAGNOSIS — H6980 Other specified disorders of Eustachian tube, unspecified ear: Secondary | ICD-10-CM | POA: Diagnosis not present

## 2012-08-04 DIAGNOSIS — R1312 Dysphagia, oropharyngeal phase: Secondary | ICD-10-CM | POA: Diagnosis not present

## 2012-08-04 DIAGNOSIS — K219 Gastro-esophageal reflux disease without esophagitis: Secondary | ICD-10-CM | POA: Diagnosis not present

## 2012-08-04 DIAGNOSIS — K120 Recurrent oral aphthae: Secondary | ICD-10-CM | POA: Diagnosis not present

## 2012-08-05 DIAGNOSIS — H6980 Other specified disorders of Eustachian tube, unspecified ear: Secondary | ICD-10-CM | POA: Diagnosis not present

## 2012-08-05 DIAGNOSIS — H902 Conductive hearing loss, unspecified: Secondary | ICD-10-CM | POA: Diagnosis not present

## 2012-08-23 DIAGNOSIS — M810 Age-related osteoporosis without current pathological fracture: Secondary | ICD-10-CM | POA: Diagnosis not present

## 2012-08-23 DIAGNOSIS — I10 Essential (primary) hypertension: Secondary | ICD-10-CM | POA: Diagnosis not present

## 2012-08-25 DIAGNOSIS — K219 Gastro-esophageal reflux disease without esophagitis: Secondary | ICD-10-CM | POA: Diagnosis not present

## 2012-08-25 DIAGNOSIS — H902 Conductive hearing loss, unspecified: Secondary | ICD-10-CM | POA: Diagnosis not present

## 2012-08-25 DIAGNOSIS — H7200 Central perforation of tympanic membrane, unspecified ear: Secondary | ICD-10-CM | POA: Diagnosis not present

## 2012-08-26 DIAGNOSIS — R109 Unspecified abdominal pain: Secondary | ICD-10-CM | POA: Diagnosis not present

## 2012-08-26 DIAGNOSIS — R197 Diarrhea, unspecified: Secondary | ICD-10-CM | POA: Diagnosis not present

## 2012-08-26 DIAGNOSIS — I10 Essential (primary) hypertension: Secondary | ICD-10-CM | POA: Diagnosis not present

## 2012-08-30 DIAGNOSIS — R197 Diarrhea, unspecified: Secondary | ICD-10-CM | POA: Diagnosis not present

## 2012-09-07 DIAGNOSIS — I10 Essential (primary) hypertension: Secondary | ICD-10-CM | POA: Diagnosis not present

## 2012-09-07 DIAGNOSIS — M810 Age-related osteoporosis without current pathological fracture: Secondary | ICD-10-CM | POA: Diagnosis not present

## 2012-09-07 DIAGNOSIS — R109 Unspecified abdominal pain: Secondary | ICD-10-CM | POA: Diagnosis not present

## 2012-09-14 DIAGNOSIS — R1013 Epigastric pain: Secondary | ICD-10-CM | POA: Diagnosis not present

## 2012-09-15 DIAGNOSIS — R1013 Epigastric pain: Secondary | ICD-10-CM | POA: Diagnosis not present

## 2012-11-01 DIAGNOSIS — R1013 Epigastric pain: Secondary | ICD-10-CM | POA: Diagnosis not present

## 2012-11-09 DIAGNOSIS — Z23 Encounter for immunization: Secondary | ICD-10-CM | POA: Diagnosis not present

## 2012-11-10 ENCOUNTER — Encounter: Payer: Self-pay | Admitting: Gastroenterology

## 2012-12-13 ENCOUNTER — Ambulatory Visit: Payer: Medicare Other | Admitting: Gastroenterology

## 2013-01-31 DIAGNOSIS — K120 Recurrent oral aphthae: Secondary | ICD-10-CM | POA: Diagnosis not present

## 2013-02-21 DIAGNOSIS — R05 Cough: Secondary | ICD-10-CM | POA: Diagnosis not present

## 2013-02-21 DIAGNOSIS — R059 Cough, unspecified: Secondary | ICD-10-CM | POA: Diagnosis not present

## 2013-02-21 DIAGNOSIS — K120 Recurrent oral aphthae: Secondary | ICD-10-CM | POA: Diagnosis not present

## 2013-02-21 DIAGNOSIS — J069 Acute upper respiratory infection, unspecified: Secondary | ICD-10-CM | POA: Diagnosis not present

## 2013-02-25 DIAGNOSIS — R05 Cough: Secondary | ICD-10-CM | POA: Diagnosis not present

## 2013-02-25 DIAGNOSIS — R059 Cough, unspecified: Secondary | ICD-10-CM | POA: Diagnosis not present

## 2013-03-07 DIAGNOSIS — I10 Essential (primary) hypertension: Secondary | ICD-10-CM | POA: Diagnosis not present

## 2013-03-10 DIAGNOSIS — M810 Age-related osteoporosis without current pathological fracture: Secondary | ICD-10-CM | POA: Diagnosis not present

## 2013-03-15 DIAGNOSIS — I10 Essential (primary) hypertension: Secondary | ICD-10-CM | POA: Diagnosis not present

## 2013-03-15 DIAGNOSIS — M810 Age-related osteoporosis without current pathological fracture: Secondary | ICD-10-CM | POA: Diagnosis not present

## 2013-03-22 DIAGNOSIS — M949 Disorder of cartilage, unspecified: Secondary | ICD-10-CM | POA: Diagnosis not present

## 2013-03-22 DIAGNOSIS — M899 Disorder of bone, unspecified: Secondary | ICD-10-CM | POA: Diagnosis not present

## 2013-03-29 DIAGNOSIS — M810 Age-related osteoporosis without current pathological fracture: Secondary | ICD-10-CM | POA: Diagnosis not present

## 2013-07-11 DIAGNOSIS — Z1231 Encounter for screening mammogram for malignant neoplasm of breast: Secondary | ICD-10-CM | POA: Diagnosis not present

## 2013-07-12 DIAGNOSIS — M949 Disorder of cartilage, unspecified: Secondary | ICD-10-CM | POA: Diagnosis not present

## 2013-07-12 DIAGNOSIS — M899 Disorder of bone, unspecified: Secondary | ICD-10-CM | POA: Diagnosis not present

## 2013-09-09 DIAGNOSIS — M810 Age-related osteoporosis without current pathological fracture: Secondary | ICD-10-CM | POA: Diagnosis not present

## 2013-09-09 DIAGNOSIS — M899 Disorder of bone, unspecified: Secondary | ICD-10-CM | POA: Diagnosis not present

## 2013-09-09 DIAGNOSIS — M949 Disorder of cartilage, unspecified: Secondary | ICD-10-CM | POA: Diagnosis not present

## 2013-09-10 DIAGNOSIS — M810 Age-related osteoporosis without current pathological fracture: Secondary | ICD-10-CM | POA: Diagnosis not present

## 2013-09-22 DIAGNOSIS — I10 Essential (primary) hypertension: Secondary | ICD-10-CM | POA: Diagnosis not present

## 2013-09-30 DIAGNOSIS — M810 Age-related osteoporosis without current pathological fracture: Secondary | ICD-10-CM | POA: Diagnosis not present

## 2013-10-14 DIAGNOSIS — Z23 Encounter for immunization: Secondary | ICD-10-CM | POA: Diagnosis not present

## 2013-10-25 DIAGNOSIS — M810 Age-related osteoporosis without current pathological fracture: Secondary | ICD-10-CM | POA: Diagnosis not present

## 2013-10-25 DIAGNOSIS — I10 Essential (primary) hypertension: Secondary | ICD-10-CM | POA: Diagnosis not present

## 2013-10-25 DIAGNOSIS — M19041 Primary osteoarthritis, right hand: Secondary | ICD-10-CM | POA: Diagnosis not present

## 2013-10-25 DIAGNOSIS — M79644 Pain in right finger(s): Secondary | ICD-10-CM | POA: Diagnosis not present

## 2013-12-20 DIAGNOSIS — I10 Essential (primary) hypertension: Secondary | ICD-10-CM | POA: Diagnosis not present

## 2014-03-02 DIAGNOSIS — R609 Edema, unspecified: Secondary | ICD-10-CM | POA: Diagnosis not present

## 2014-03-02 DIAGNOSIS — M810 Age-related osteoporosis without current pathological fracture: Secondary | ICD-10-CM | POA: Diagnosis not present

## 2014-03-02 DIAGNOSIS — I10 Essential (primary) hypertension: Secondary | ICD-10-CM | POA: Diagnosis not present

## 2014-03-02 DIAGNOSIS — H109 Unspecified conjunctivitis: Secondary | ICD-10-CM | POA: Diagnosis not present

## 2014-03-07 DIAGNOSIS — I10 Essential (primary) hypertension: Secondary | ICD-10-CM | POA: Diagnosis not present

## 2014-03-07 DIAGNOSIS — M8580 Other specified disorders of bone density and structure, unspecified site: Secondary | ICD-10-CM | POA: Diagnosis not present

## 2014-03-07 DIAGNOSIS — Z79899 Other long term (current) drug therapy: Secondary | ICD-10-CM | POA: Diagnosis not present

## 2014-03-07 DIAGNOSIS — M810 Age-related osteoporosis without current pathological fracture: Secondary | ICD-10-CM | POA: Diagnosis not present

## 2014-03-30 DIAGNOSIS — M810 Age-related osteoporosis without current pathological fracture: Secondary | ICD-10-CM | POA: Diagnosis not present

## 2014-03-30 DIAGNOSIS — R609 Edema, unspecified: Secondary | ICD-10-CM | POA: Diagnosis not present

## 2014-03-30 DIAGNOSIS — I10 Essential (primary) hypertension: Secondary | ICD-10-CM | POA: Diagnosis not present

## 2014-04-03 DIAGNOSIS — M810 Age-related osteoporosis without current pathological fracture: Secondary | ICD-10-CM | POA: Diagnosis not present

## 2014-04-17 DIAGNOSIS — H25011 Cortical age-related cataract, right eye: Secondary | ICD-10-CM | POA: Diagnosis not present

## 2014-04-17 DIAGNOSIS — H11151 Pinguecula, right eye: Secondary | ICD-10-CM | POA: Diagnosis not present

## 2014-04-17 DIAGNOSIS — H2512 Age-related nuclear cataract, left eye: Secondary | ICD-10-CM | POA: Diagnosis not present

## 2014-04-17 DIAGNOSIS — H2511 Age-related nuclear cataract, right eye: Secondary | ICD-10-CM | POA: Diagnosis not present

## 2014-04-26 DIAGNOSIS — I10 Essential (primary) hypertension: Secondary | ICD-10-CM | POA: Diagnosis not present

## 2014-04-26 DIAGNOSIS — M810 Age-related osteoporosis without current pathological fracture: Secondary | ICD-10-CM | POA: Diagnosis not present

## 2014-05-03 DIAGNOSIS — M810 Age-related osteoporosis without current pathological fracture: Secondary | ICD-10-CM | POA: Diagnosis not present

## 2014-05-03 DIAGNOSIS — I10 Essential (primary) hypertension: Secondary | ICD-10-CM | POA: Diagnosis not present

## 2014-05-03 DIAGNOSIS — R109 Unspecified abdominal pain: Secondary | ICD-10-CM | POA: Diagnosis not present

## 2014-05-05 ENCOUNTER — Telehealth: Payer: Self-pay | Admitting: Gastroenterology

## 2014-05-05 NOTE — Telephone Encounter (Signed)
I have left message for the patient to call back 

## 2014-05-11 NOTE — Telephone Encounter (Signed)
Spoke with the patient. She declines an appointment with Dr Deatra Ina. She will consider an appointment with an extender. She states she will call back.

## 2014-05-22 DIAGNOSIS — M810 Age-related osteoporosis without current pathological fracture: Secondary | ICD-10-CM | POA: Diagnosis not present

## 2014-05-22 DIAGNOSIS — N8112 Cystocele, lateral: Secondary | ICD-10-CM | POA: Diagnosis not present

## 2014-05-22 DIAGNOSIS — Z01419 Encounter for gynecological examination (general) (routine) without abnormal findings: Secondary | ICD-10-CM | POA: Diagnosis not present

## 2014-05-22 DIAGNOSIS — R350 Frequency of micturition: Secondary | ICD-10-CM | POA: Diagnosis not present

## 2014-05-22 DIAGNOSIS — N905 Atrophy of vulva: Secondary | ICD-10-CM | POA: Diagnosis not present

## 2014-05-22 DIAGNOSIS — N816 Rectocele: Secondary | ICD-10-CM | POA: Diagnosis not present

## 2014-08-14 ENCOUNTER — Ambulatory Visit (INDEPENDENT_AMBULATORY_CARE_PROVIDER_SITE_OTHER): Payer: Medicare Other | Admitting: Gastroenterology

## 2014-08-14 ENCOUNTER — Encounter: Payer: Self-pay | Admitting: Gastroenterology

## 2014-08-14 VITALS — BP 118/70 | Ht 60.6 in | Wt 163.4 lb

## 2014-08-14 DIAGNOSIS — R1013 Epigastric pain: Secondary | ICD-10-CM

## 2014-08-14 NOTE — Patient Instructions (Addendum)
You will be set up for an upper endoscopy (for epigastric pains). You will be set up for a colonoscopy for screening. You will have labs checked today in the basement lab.  Please head down after you check out with the front desk  (cbc, cmet).  Normal BMI (Body Mass Index- based on height and weight) is between 23 and 30. Your BMI today is Body mass index is 31.29 kg/(m^2). Marland Kitchen Please consider follow up  regarding your BMI with your Primary Care Provider.

## 2014-08-14 NOTE — Progress Notes (Signed)
Review of pertinent gastrointestinal problems: 1. EGD 2009 Dr. Deatra Ina for dysphagia, abd pain; maloney dilated mild GE junction stricture; biopsied slightly duodenum and it was normal. 2. Routine risk for colon cancer. Colonoscopy 05/2005 was normal. Due for recall screening colonoscopy 2017. 3. Pancreatic insufficiency?: had been on panc enzymes for years prior to establishing with Dr. Ardis Hughs 2011, unclear why however she was clear that they helped her dypepsia. CT scan 2011 showed normal pancreas.   HPI: This is a very pleasant 74 year old woman whom I last saw about 72 years ago. She is here with her husband today.  She is here to discuss epigastric pain and also intermittent loose stools, colon cancer screening.  The pain pain is constant.   located in her mid epigastrium. Non radiated. The pain is non specific.  Eating makes it worse.  Sometimes nausea with it.  No OTC pain meds. The pain has been going on for at least a year.  Usually has BMs shortly after eating.  She says she has loose stools 3-4 times per week.  She takes creon still, only PRN. Temporarily helps.  Overall her weight has been stable.  Review of systems: Pertinent positive and negative review of systems were noted in the above HPI section. Complete review of systems was performed and was otherwise normal.   Past Medical History  Diagnosis Date  . Osteopenia   . Intolerance of drug     biophosphates  . Dyspepsia     chronic s/p EGD negative 2009  . History of Helicobacter infection     treated x 2  . Hx of colonoscopy 06/2005    no polyps found, next colonoscopy 2017  . Hypertension     Past Surgical History  Procedure Laterality Date  . Breast lumpectomy  1980    Current Outpatient Prescriptions  Medication Sig Dispense Refill  . hydrochlorothiazide (HYDRODIURIL) 12.5 MG tablet Take 12.5 mg by mouth daily.    Marland Kitchen olmesartan (BENICAR) 40 MG tablet Take 40 mg by mouth daily.    . Pancrelipase,  Lip-Prot-Amyl, (CREON) 24000 UNITS CPEP Take by mouth as needed.     No current facility-administered medications for this visit.    Allergies as of 08/14/2014  . (No Known Allergies)    Family History  Problem Relation Age of Onset  . Uterine cancer Mother     deceased  . Hypertension Father     deceased  . Other      no history of breast cancer, diabetes or colon cancer    History   Social History  . Marital Status: Married    Spouse Name: N/A  . Number of Children: N/A  . Years of Education: N/A   Occupational History  . retired    Social History Main Topics  . Smoking status: Never Smoker   . Smokeless tobacco: Never Used  . Alcohol Use: Not on file  . Drug Use: Not on file  . Sexual Activity: Not on file   Other Topics Concern  . Not on file   Social History Narrative   Retired   Married > 40 yrs    4 daughters    Never Smoked       Alcohol use-no        Physical Exam: BP 118/70 mmHg  Ht 5' 0.6" (1.539 m)  Wt 163 lb 6 oz (74.106 kg)  BMI 31.29 kg/m2 Constitutional: generally well-appearing Psychiatric: alert and oriented x3 Eyes: extraocular movements intact Mouth: oral pharynx moist,  no lesions Neck: supple no lymphadenopathy Cardiovascular: heart regular rate and rhythm Lungs: clear to auscultation bilaterally Abdomen: soft, nontender, nondistended, no obvious ascites, no peritoneal signs, normal bowel sounds Extremities: no lower extremity edema bilaterally Skin: no lesions on visible extremities   Assessment and plan: 74 y.o. female with   epigastric pain, chronic. Postprandial loose stools but otherwise routine risk for colon cancer she has not been losing weight, has no real alarm symptoms I think it is unlikely that there is anything serious going on. She has had H. pylori infection in the past and would like to look in her stomach and I think that is reasonable. She is also due for colon cancer screening in less than a year and I  recommended we do that for her same time since she will be here anyway getting sedation. She will have a basic set of labs today as well including a CBC and complete metabolic profile.  Owens Loffler, MD [facility] 08/14/2014, 8:30 AM  Cc: Shawna Orleans, Doe-Hyun R, DO

## 2014-08-21 ENCOUNTER — Encounter: Payer: Self-pay | Admitting: Gastroenterology

## 2014-08-21 ENCOUNTER — Ambulatory Visit (AMBULATORY_SURGERY_CENTER): Payer: Medicare Other | Admitting: Gastroenterology

## 2014-08-21 VITALS — BP 97/58 | HR 53 | Temp 98.2°F | Resp 50 | Ht 60.0 in | Wt 163.0 lb

## 2014-08-21 DIAGNOSIS — K2970 Gastritis, unspecified, without bleeding: Secondary | ICD-10-CM

## 2014-08-21 DIAGNOSIS — R1013 Epigastric pain: Secondary | ICD-10-CM

## 2014-08-21 DIAGNOSIS — K2950 Unspecified chronic gastritis without bleeding: Secondary | ICD-10-CM | POA: Diagnosis not present

## 2014-08-21 DIAGNOSIS — Z1211 Encounter for screening for malignant neoplasm of colon: Secondary | ICD-10-CM | POA: Diagnosis not present

## 2014-08-21 DIAGNOSIS — I10 Essential (primary) hypertension: Secondary | ICD-10-CM | POA: Diagnosis not present

## 2014-08-21 DIAGNOSIS — R195 Other fecal abnormalities: Secondary | ICD-10-CM

## 2014-08-21 MED ORDER — SODIUM CHLORIDE 0.9 % IV SOLN: 500.0000 mL | INTRAVENOUS | Status: DC

## 2014-08-21 NOTE — Patient Instructions (Signed)
Discharge instructions given. Handout on gastritis. Resume previous medications. YOU HAD AN ENDOSCOPIC PROCEDURE TODAY AT [facility]:   Refer to the procedure report that was given to you for any specific questions about what was found during the examination.  If the procedure report does not answer your questions, please call your gastroenterologist to clarify.  If you requested that your care partner not be given the details of your procedure findings, then the procedure report has been included in a sealed envelope for you to review at your convenience later.  YOU SHOULD EXPECT: Some feelings of bloating in the abdomen. Passage of more gas than usual.  Walking can help get rid of the air that was put into your GI tract during the procedure and reduce the bloating. If you had a lower endoscopy (such as a colonoscopy or flexible sigmoidoscopy) you may notice spotting of blood in your stool or on the toilet paper. If you underwent a bowel prep for your procedure, you may not have a normal bowel movement for a few days.  Please Note:  You might notice some irritation and congestion in your nose or some drainage.  This is from the oxygen used during your procedure.  There is no need for concern and it should clear up in a day or so.  SYMPTOMS TO REPORT IMMEDIATELY:   Following lower endoscopy (colonoscopy or flexible sigmoidoscopy):  Excessive amounts of blood in the stool  Significant tenderness or worsening of abdominal pains  Swelling of the abdomen that is new, acute  Fever of 100F or higher   Following upper endoscopy (EGD)  Vomiting of blood or coffee ground material  New chest pain or pain under the shoulder blades  Painful or persistently difficult swallowing  New shortness of breath  Fever of 100F or higher  Black, tarry-looking stools  For urgent or emergent issues, a gastroenterologist can be reached at any hour by calling [phone].   DIET: Your first  meal following the procedure should be a small meal and then it is ok to progress to your normal diet. Heavy or fried foods are harder to digest and may make you feel nauseous or bloated.  Likewise, meals heavy in dairy and vegetables can increase bloating.  Drink plenty of fluids but you should avoid alcoholic beverages for 24 hours.  ACTIVITY:  You should plan to take it easy for the rest of today and you should NOT DRIVE or use heavy machinery until tomorrow (because of the sedation medicines used during the test).    FOLLOW UP: Our staff will call the number listed on your records the next business day following your procedure to check on you and address any questions or concerns that you may have regarding the information given to you following your procedure. If we do not reach you, we will leave a message.  However, if you are feeling well and you are not experiencing any problems, there is no need to return our call.  We will assume that you have returned to your regular daily activities without incident.  If any biopsies were taken you will be contacted by phone or by letter within the next 1-3 weeks.  Please call us at [phone] if you have not heard about the biopsies in 3 weeks.    SIGNATURES/CONFIDENTIALITY: You and/or your care partner have signed paperwork which will be entered into your electronic medical record.  These signatures attest to the fact that that the information  above on your After Visit Summary has been reviewed and is understood.  Full responsibility of the confidentiality of this discharge information lies with you and/or your care-partner. 

## 2014-08-21 NOTE — Progress Notes (Signed)
Report to PACU, RN, vss, BBS= Clear.  

## 2014-08-21 NOTE — Op Note (Signed)
[place]  [address] [place], [id number]   COLONOSCOPY PROCEDURE REPORT  PATIENT: Patricia Hansen, Patricia Hansen  MR#: [id number] BIRTHDATE: [birth year] , 74  yrs. old GENDER: female ENDOSCOPIST: Milus Banister, MD PROCEDURE DATE:  08/21/2014 PROCEDURE:   Colonoscopy, screening First Screening Colonoscopy - Avg.  risk and is 50 yrs.  old or older - No.  Prior Negative Screening - Now for repeat screening. Other: See Comments  History of Adenoma - Now for follow-up colonoscopy & has been > or = to 3 yrs.  N/A  Recommend repeat exam, <10 yrs? No ASA CLASS:   Class II INDICATIONS:loose stools. MEDICATIONS: Monitored anesthesia care and Propofol 200 mg IV  DESCRIPTION OF PROCEDURE:   After the risks benefits and alternatives of the procedure were thoroughly explained, informed consent was obtained.  The digital rectal exam revealed no abnormalities of the rectum.   The LB [id number]  endoscope was introduced through the anus and advanced to the cecum, which was identified by both the appendix and ileocecal valve. No adverse events experienced.   The quality of the prep was excellent.  The instrument was then slowly withdrawn as the colon was fully examined. Estimated blood loss is zero unless otherwise noted in this procedure report.   COLON FINDINGS: A normal appearing cecum, ileocecal valve, and appendiceal orifice were identified.  The ascending, transverse, descending, sigmoid colon, and rectum appeared unremarkable. Retroflexed views revealed no abnormalities. The time to cecum = 1.7 Withdrawal time = 6.5   The scope was withdrawn and the procedure completed. COMPLICATIONS: There were no immediate complications.  ENDOSCOPIC IMPRESSION: Normal colonoscopy No polyps or cancers  RECOMMENDATIONS: You will not need further colon cancer screning tests.  These types of tests usually stop around the age 81.  eSigned:  Milus Banister, MD 2014-08-21  86:76:[id number]

## 2014-08-21 NOTE — Op Note (Signed)
[place]  [address] [place], [id number]   ENDOSCOPY PROCEDURE REPORT  PATIENT: Patricia Hansen, Patricia Hansen  MR#: [id number] BIRTHDATE: 03-Sep-1940 , 74  yrs. old GENDER: female ENDOSCOPIST: Milus Banister, MD PROCEDURE DATE:  08/21/2014 PROCEDURE:  EGD w/ biopsy ASA CLASS:     Class II INDICATIONS:  intermittent epiagstric pain, h/o H.  pylori. MEDICATIONS: Residual sedation present and lidocaine 50mg  IV TOPICAL ANESTHETIC: none  DESCRIPTION OF PROCEDURE: After the risks benefits and alternatives of the procedure were thoroughly explained, informed consent was obtained.  The LB [id number] endoscope was introduced through the mouth and advanced to the second portion of the duodenum , Without limitations.  The instrument was slowly withdrawn as the mucosa was fully examined.  There was mild, non-specific distal gastritis.  This was biopsied and sent to pathology.  The examination was otherwise normal. Retroflexed views revealed no abnormalities.     The scope was then withdrawn from the patient and the procedure completed. COMPLICATIONS: There were no immediate complications.  ENDOSCOPIC IMPRESSION: There was mild, non-specific distal gastritis.  This was biopsied and sent to pathology.  The examination was otherwise normal  RECOMMENDATIONS: Await biopsy results   eSigned:  Milus Banister, MD 2014-08-21 69:79:48.016

## 2014-08-21 NOTE — Progress Notes (Signed)
Called to room to assist during endoscopic procedure.  Patient ID and intended procedure confirmed with present staff. Received instructions for my participation in the procedure from the performing physician.  

## 2014-08-22 ENCOUNTER — Telehealth: Payer: Self-pay | Admitting: Emergency Medicine

## 2014-08-22 NOTE — Telephone Encounter (Signed)
  Follow up Call-  Call back number 08/21/2014  Post procedure Call Back phone  # [phone] home  Permission to leave phone message Yes     Patient questions:  Do you have a fever, pain , or abdominal swelling? No. Pain Score  0 *  Have you tolerated food without any problems? Yes.    Have you been able to return to your normal activities? Yes.    Do you have any questions about your discharge instructions: Diet   No. Medications  No. Follow up visit  No.  Do you have questions or concerns about your Care? No.  Actions: * If pain score is 4 or above: No action needed, pain <4.

## 2014-08-28 ENCOUNTER — Encounter: Payer: Self-pay | Admitting: Gastroenterology

## 2014-10-04 DIAGNOSIS — M810 Age-related osteoporosis without current pathological fracture: Secondary | ICD-10-CM | POA: Diagnosis not present

## 2014-10-20 DIAGNOSIS — Z23 Encounter for immunization: Secondary | ICD-10-CM | POA: Diagnosis not present

## 2014-10-23 DIAGNOSIS — M859 Disorder of bone density and structure, unspecified: Secondary | ICD-10-CM | POA: Diagnosis not present

## 2014-10-23 DIAGNOSIS — E559 Vitamin D deficiency, unspecified: Secondary | ICD-10-CM | POA: Diagnosis not present

## 2014-10-23 DIAGNOSIS — I10 Essential (primary) hypertension: Secondary | ICD-10-CM | POA: Diagnosis not present

## 2014-11-01 DIAGNOSIS — I10 Essential (primary) hypertension: Secondary | ICD-10-CM | POA: Diagnosis not present

## 2014-11-01 DIAGNOSIS — M810 Age-related osteoporosis without current pathological fracture: Secondary | ICD-10-CM | POA: Diagnosis not present

## 2015-04-03 DIAGNOSIS — M810 Age-related osteoporosis without current pathological fracture: Secondary | ICD-10-CM | POA: Diagnosis not present

## 2015-04-30 DIAGNOSIS — E559 Vitamin D deficiency, unspecified: Secondary | ICD-10-CM | POA: Diagnosis not present

## 2015-04-30 DIAGNOSIS — M810 Age-related osteoporosis without current pathological fracture: Secondary | ICD-10-CM | POA: Diagnosis not present

## 2015-04-30 DIAGNOSIS — I10 Essential (primary) hypertension: Secondary | ICD-10-CM | POA: Diagnosis not present

## 2015-05-08 DIAGNOSIS — I10 Essential (primary) hypertension: Secondary | ICD-10-CM | POA: Diagnosis not present

## 2015-05-08 DIAGNOSIS — M810 Age-related osteoporosis without current pathological fracture: Secondary | ICD-10-CM | POA: Diagnosis not present

## 2015-06-26 DIAGNOSIS — M810 Age-related osteoporosis without current pathological fracture: Secondary | ICD-10-CM | POA: Diagnosis not present

## 2015-06-26 DIAGNOSIS — E559 Vitamin D deficiency, unspecified: Secondary | ICD-10-CM | POA: Diagnosis not present

## 2015-07-16 DIAGNOSIS — M8588 Other specified disorders of bone density and structure, other site: Secondary | ICD-10-CM | POA: Diagnosis not present

## 2015-07-16 DIAGNOSIS — M810 Age-related osteoporosis without current pathological fracture: Secondary | ICD-10-CM | POA: Diagnosis not present

## 2015-07-16 DIAGNOSIS — Z1231 Encounter for screening mammogram for malignant neoplasm of breast: Secondary | ICD-10-CM | POA: Diagnosis not present

## 2015-07-17 DIAGNOSIS — H43811 Vitreous degeneration, right eye: Secondary | ICD-10-CM | POA: Diagnosis not present

## 2015-07-17 DIAGNOSIS — H2513 Age-related nuclear cataract, bilateral: Secondary | ICD-10-CM | POA: Diagnosis not present

## 2015-07-17 DIAGNOSIS — H524 Presbyopia: Secondary | ICD-10-CM | POA: Diagnosis not present

## 2015-07-17 DIAGNOSIS — H1789 Other corneal scars and opacities: Secondary | ICD-10-CM | POA: Diagnosis not present

## 2015-09-07 ENCOUNTER — Other Ambulatory Visit: Payer: Self-pay

## 2015-09-13 DIAGNOSIS — Z23 Encounter for immunization: Secondary | ICD-10-CM | POA: Diagnosis not present

## 2015-10-05 DIAGNOSIS — M810 Age-related osteoporosis without current pathological fracture: Secondary | ICD-10-CM | POA: Diagnosis not present

## 2015-11-05 DIAGNOSIS — I10 Essential (primary) hypertension: Secondary | ICD-10-CM | POA: Diagnosis not present

## 2015-11-12 DIAGNOSIS — M810 Age-related osteoporosis without current pathological fracture: Secondary | ICD-10-CM | POA: Diagnosis not present

## 2015-11-12 DIAGNOSIS — I10 Essential (primary) hypertension: Secondary | ICD-10-CM | POA: Diagnosis not present

## 2015-11-12 DIAGNOSIS — E559 Vitamin D deficiency, unspecified: Secondary | ICD-10-CM | POA: Diagnosis not present

## 2015-11-12 DIAGNOSIS — L309 Dermatitis, unspecified: Secondary | ICD-10-CM | POA: Diagnosis not present

## 2015-12-04 DIAGNOSIS — L918 Other hypertrophic disorders of the skin: Secondary | ICD-10-CM | POA: Diagnosis not present

## 2015-12-06 DIAGNOSIS — H04123 Dry eye syndrome of bilateral lacrimal glands: Secondary | ICD-10-CM | POA: Diagnosis not present

## 2016-04-03 DIAGNOSIS — M810 Age-related osteoporosis without current pathological fracture: Secondary | ICD-10-CM | POA: Diagnosis not present

## 2016-05-06 DIAGNOSIS — M810 Age-related osteoporosis without current pathological fracture: Secondary | ICD-10-CM | POA: Diagnosis not present

## 2016-05-06 DIAGNOSIS — I10 Essential (primary) hypertension: Secondary | ICD-10-CM | POA: Diagnosis not present

## 2016-05-06 DIAGNOSIS — E559 Vitamin D deficiency, unspecified: Secondary | ICD-10-CM | POA: Diagnosis not present

## 2016-05-13 DIAGNOSIS — I10 Essential (primary) hypertension: Secondary | ICD-10-CM | POA: Diagnosis not present

## 2016-05-13 DIAGNOSIS — Z23 Encounter for immunization: Secondary | ICD-10-CM | POA: Diagnosis not present

## 2016-05-13 DIAGNOSIS — M810 Age-related osteoporosis without current pathological fracture: Secondary | ICD-10-CM | POA: Diagnosis not present

## 2016-05-13 DIAGNOSIS — Z0000 Encounter for general adult medical examination without abnormal findings: Secondary | ICD-10-CM | POA: Diagnosis not present

## 2016-08-11 DIAGNOSIS — E559 Vitamin D deficiency, unspecified: Secondary | ICD-10-CM | POA: Diagnosis not present

## 2016-08-11 DIAGNOSIS — M810 Age-related osteoporosis without current pathological fracture: Secondary | ICD-10-CM | POA: Diagnosis not present

## 2016-09-15 DIAGNOSIS — Z23 Encounter for immunization: Secondary | ICD-10-CM | POA: Diagnosis not present

## 2016-10-06 DIAGNOSIS — M810 Age-related osteoporosis without current pathological fracture: Secondary | ICD-10-CM | POA: Diagnosis not present

## 2016-10-13 DIAGNOSIS — H04123 Dry eye syndrome of bilateral lacrimal glands: Secondary | ICD-10-CM | POA: Diagnosis not present

## 2017-01-07 DIAGNOSIS — L259 Unspecified contact dermatitis, unspecified cause: Secondary | ICD-10-CM | POA: Diagnosis not present

## 2017-01-20 DIAGNOSIS — R21 Rash and other nonspecific skin eruption: Secondary | ICD-10-CM | POA: Diagnosis not present

## 2017-02-02 ENCOUNTER — Telehealth: Payer: Self-pay | Admitting: Family Medicine

## 2017-02-02 ENCOUNTER — Ambulatory Visit (INDEPENDENT_AMBULATORY_CARE_PROVIDER_SITE_OTHER): Payer: Medicare Other | Admitting: Family Medicine

## 2017-02-02 ENCOUNTER — Encounter: Payer: Self-pay | Admitting: Family Medicine

## 2017-02-02 VITALS — BP 118/64 | HR 76 | Temp 97.8°F | Ht 60.0 in | Wt 165.2 lb

## 2017-02-02 DIAGNOSIS — Z1239 Encounter for other screening for malignant neoplasm of breast: Secondary | ICD-10-CM

## 2017-02-02 DIAGNOSIS — Z1231 Encounter for screening mammogram for malignant neoplasm of breast: Secondary | ICD-10-CM | POA: Diagnosis not present

## 2017-02-02 DIAGNOSIS — I10 Essential (primary) hypertension: Secondary | ICD-10-CM

## 2017-02-02 DIAGNOSIS — M810 Age-related osteoporosis without current pathological fracture: Secondary | ICD-10-CM | POA: Diagnosis not present

## 2017-02-02 MED ORDER — OLMESARTAN MEDOXOMIL 20 MG PO TABS: 20.0000 mg | ORAL_TABLET | Freq: Every day | ORAL | 3 refills | Status: DC

## 2017-02-02 MED ORDER — HYDROCHLOROTHIAZIDE 12.5 MG PO TABS: 12.5000 mg | ORAL_TABLET | Freq: Every day | ORAL | 3 refills | Status: DC

## 2017-02-02 NOTE — Telephone Encounter (Signed)
[place], This is a new patient for Dr. Nani Ravens.  She gets a prolia every 6 months.  Her next one is due 04/03/2017.  Could you please begin the authorization for this.  Thanks,

## 2017-02-02 NOTE — Progress Notes (Signed)
Pre visit review using our clinic review tool, if applicable. No additional management support is needed unless otherwise documented below in the visit note. 

## 2017-02-02 NOTE — Patient Instructions (Addendum)
Keep up the good work!  If you do not hear anything about your mammogram in the next week, call our office and ask for an update.  Let us know if you need anything.

## 2017-02-02 NOTE — Progress Notes (Signed)
Chief Complaint  Patient presents with  . Establish Care       New Patient Visit SUBJECTIVE: HPI: Patricia Hansen is an 77 y.o.female who is being seen for establishing care.  The patient was previously seen at Dr. Lenna Sciara Kim's office.  Hypertension Patient presents for hypertension follow up. She does not routinely monitor home blood pressures. She is compliant with medications- hctz 12.5 mg/d and olmesartan 20 mg/d. Patient has these side effects of medication: none She is adhering to a healthy diet overall. Exercise: golfs daily when weather is nice  She has a history of osteoporosis.  She takes Prolia every 6 months.  Her next dose is due at the end of March.  She does take vitamin D.  Her last bone density scan was July 2017.   No Known Allergies  Past Medical History:  Diagnosis Date  . Dyspepsia    chronic s/p EGD negative 2009  . History of Helicobacter infection    treated x 2  . Hx of colonoscopy 06/2005   no polyps found, next colonoscopy 2017  . Hypertension   . Intolerance of drug    biophosphates  . Osteopenia    Past Surgical History:  Procedure Laterality Date  . BREAST LUMPECTOMY  1980   Social History   Socioeconomic History  . Marital status: Married  Tobacco Use  . Smoking status: Never Smoker  . Smokeless tobacco: Never Used  Social History Narrative   Retired   Married > 46 yrs    4 daughters    Never Smoked       Alcohol use-no      Family History  Problem Relation Age of Onset  . Uterine cancer Mother        deceased  . Hypertension Father        deceased  . Other Unknown        no history of breast cancer, diabetes or colon cancer  . Colon polyps Neg Hx   . Colon cancer Neg Hx     Current Outpatient Medications:  .  hydrochlorothiazide (HYDRODIURIL) 12.5 MG tablet, Take 1 tablet (12.5 mg total) by mouth daily., Disp: 90 tablet, Rfl: 3 .  Pancrelipase, Lip-Prot-Amyl, (CREON) 24000 UNITS CPEP, Take by mouth as needed., Disp: , Rfl:  .   olmesartan (BENICAR) 20 MG tablet, Take 1 tablet (20 mg total) by mouth daily., Disp: 90 tablet, Rfl: 3  No LMP recorded. Patient is postmenopausal.  ROS Cardiovascular: Denies chest pain  Respiratory: Denies dyspnea   OBJECTIVE: BP 118/64 (BP Location: Left Arm, Patient Position: Sitting, Cuff Size: Normal)   Pulse 76   Temp 97.8 F (36.6 C) (Oral)   Ht 5' (1.524 m)   Wt 165 lb 4 oz (75 kg)   SpO2 97%   BMI 32.27 kg/m   Constitutional: -  VS reviewed -  Well developed, well nourished, appears stated age -  No apparent distress  Psychiatric: -  Oriented to person, place, and time -  Memory intact -  Affect and mood normal -  Fluent conversation, good eye contact -  Judgment and insight age appropriate  Eye: -  Conjunctivae clear, no discharge -  Pupils symmetric, round, reactive to light  ENMT: -  MMM    Pharynx moist, no exudate, no erythema  Neck: -  No gross swelling, no palpable masses -  Thyroid midline, not enlarged, mobile, no palpable masses  Cardiovascular: -  RRR -  No bruits -  No LE edema  Respiratory: -  Normal respiratory effort, no accessory muscle use, no retraction -  Breath sounds equal, no wheezes, no ronchi, no crackles  Musculoskeletal: -  No clubbing, no cyanosis -  Gait normal  Skin: -  No significant lesion on inspection -  Warm and dry to palpation   ASSESSMENT/PLAN: Essential hypertension - Plan: Comprehensive metabolic panel, Lipid panel  Breast cancer screening - Plan: MM DIGITAL SCREENING BILATERAL  Age-related osteoporosis without current pathological fracture - Plan: Vitamin D (25 hydroxy), CBC  Patient instructed to sign release of records form from her previous PCP. Continue current medicines. Prior authorization underway for Prolia. Patient should return in 6 mo. The patient voiced understanding and agreement to the plan.   [place], DO 02/02/17  2:15 PM

## 2017-02-05 DIAGNOSIS — M5126 Other intervertebral disc displacement, lumbar region: Secondary | ICD-10-CM | POA: Diagnosis not present

## 2017-02-05 DIAGNOSIS — M5136 Other intervertebral disc degeneration, lumbar region: Secondary | ICD-10-CM | POA: Diagnosis not present

## 2017-02-06 NOTE — Telephone Encounter (Signed)
Submitted for Benefit verification

## 2017-02-10 ENCOUNTER — Inpatient Hospital Stay (HOSPITAL_BASED_OUTPATIENT_CLINIC_OR_DEPARTMENT_OTHER): Admission: RE | Admit: 2017-02-10 | Payer: Medicare Other | Source: Ambulatory Visit

## 2017-02-12 ENCOUNTER — Ambulatory Visit (HOSPITAL_BASED_OUTPATIENT_CLINIC_OR_DEPARTMENT_OTHER)
Admission: RE | Admit: 2017-02-12 | Discharge: 2017-02-12 | Disposition: A | Discharge status: Home / Self Care | Payer: Medicare Other | Source: Ambulatory Visit | Attending: Family Medicine | Admitting: Family Medicine

## 2017-02-12 ENCOUNTER — Encounter (HOSPITAL_BASED_OUTPATIENT_CLINIC_OR_DEPARTMENT_OTHER): Payer: Self-pay

## 2017-02-12 DIAGNOSIS — Z1231 Encounter for screening mammogram for malignant neoplasm of breast: Secondary | ICD-10-CM | POA: Diagnosis not present

## 2017-02-12 DIAGNOSIS — Z1239 Encounter for other screening for malignant neoplasm of breast: Secondary | ICD-10-CM

## 2017-02-16 DIAGNOSIS — L309 Dermatitis, unspecified: Secondary | ICD-10-CM | POA: Diagnosis not present

## 2017-02-16 DIAGNOSIS — L259 Unspecified contact dermatitis, unspecified cause: Secondary | ICD-10-CM | POA: Diagnosis not present

## 2017-02-18 ENCOUNTER — Telehealth: Payer: Self-pay | Admitting: *Deleted

## 2017-02-18 NOTE — Telephone Encounter (Signed)
Received Medical records from [facility]; forwarded to provider/SLS 02/13

## 2017-03-23 ENCOUNTER — Telehealth: Payer: Self-pay | Admitting: *Deleted

## 2017-03-23 NOTE — Telephone Encounter (Signed)
Sent previous message regarding prolia to manager to inquire of this question

## 2017-03-23 NOTE — Telephone Encounter (Signed)
See 02/02/17 phone note. Staff message sent to [place] to request Prolia status.  Copied from [place]. Topic: Inquiry >> Mar 23, 2017  2:59 PM Oliver Pila B wrote: Reason for CRM: pt called and was asking for the status of her prolia shot, contact pt to advise

## 2017-03-23 NOTE — Telephone Encounter (Signed)
Have you received verification for this patients prolia yet?  She telephoned in today to inquire about it.

## 2017-03-24 NOTE — Telephone Encounter (Signed)
Per Patricia Hansen we have submitted her benefits twice and no response.  She submitted again and will let rep know.  Spoke with patient to let her know that we are working on it and that we have sent it twice with no response and that we have resent it.

## 2017-03-30 ENCOUNTER — Telehealth: Payer: Self-pay | Admitting: Family Medicine

## 2017-03-30 NOTE — Telephone Encounter (Signed)
Copied from [place] [id number]. Topic: Appointment Scheduling - Scheduling Inquiry for Clinic >> Mar 30, 2017  1:47 PM Conception Chancy, NT wrote: Patient is calling and states she is due for her 6 month prolia injection. I am not able to see if she is. Please advise and contact pt to schedule.  Looks like her last one was at [facility] on 10/2016 Please do her authorization and then let me know

## 2017-04-02 NOTE — Telephone Encounter (Signed)
I am working with the Prolia reps to get this patients account moved to our office, they are still sending her insurance verifications to her old provider which is what is causing our delay. I have reached out to the patient to let her know I am working on this and we should have a resolution by end of th week.

## 2017-04-03 NOTE — Telephone Encounter (Signed)
Prolia benefits received PA not required No OOP cost for patient   Patient may owe approximately $0 OOP  Left message for patient to call me back directly to schedule for injection- needs to be 6 months and 1 day after her last injection and I am not sure of the exact date of her last shot.

## 2017-04-07 NOTE — Telephone Encounter (Signed)
Patient is schedule for Prolia on Friday 04/10/17

## 2017-04-10 ENCOUNTER — Ambulatory Visit (INDEPENDENT_AMBULATORY_CARE_PROVIDER_SITE_OTHER): Payer: Medicare Other | Admitting: Emergency Medicine

## 2017-04-10 DIAGNOSIS — M810 Age-related osteoporosis without current pathological fracture: Secondary | ICD-10-CM

## 2017-04-10 MED ORDER — DENOSUMAB 60 MG/ML ~~LOC~~ SOLN
60.0000 mg | Freq: Once | SUBCUTANEOUS | Status: AC
  Administered 2017-04-10: 60 mg via SUBCUTANEOUS

## 2017-04-10 NOTE — Progress Notes (Signed)
Pre visit review using our clinic review tool, if applicable. No additional management support is needed unless otherwise documented below in the visit note.  Pt tolerated injection well.

## 2017-04-27 ENCOUNTER — Encounter: Payer: Self-pay | Admitting: Family Medicine

## 2017-04-27 ENCOUNTER — Ambulatory Visit (INDEPENDENT_AMBULATORY_CARE_PROVIDER_SITE_OTHER): Payer: Medicare Other | Admitting: Family Medicine

## 2017-04-27 VITALS — BP 152/72 | HR 67 | Temp 97.9°F | Ht 60.0 in | Wt 165.4 lb

## 2017-04-27 DIAGNOSIS — I10 Essential (primary) hypertension: Secondary | ICD-10-CM

## 2017-04-27 DIAGNOSIS — K921 Melena: Secondary | ICD-10-CM | POA: Diagnosis not present

## 2017-04-27 NOTE — Progress Notes (Signed)
Chief Complaint  Patient presents with  . Blood In Stools    Subjective: Patient is a 77 y.o. female here for blood in stool for past 2 days.  She has had dark/tarry stools and bright red blood in stool/tissue paper during this time. No pain, itching or injury. No fevers, nausea or vomiting. She is not on aspirin or anticoagulation. Her last colonoscopy was in 2016 and was normal.   ROS: GI: As noted HPI Const: No fevers  Family History  Problem Relation Age of Onset  . Uterine cancer Mother        deceased  . Hypertension Father        deceased  . Other Unknown        no history of breast cancer, diabetes or colon cancer  . Colon polyps Neg Hx   . Colon cancer Neg Hx    Past Medical History:  Diagnosis Date  . Dyspepsia    chronic s/p EGD negative 2009  . History of Helicobacter infection    treated x 2  . Hx of colonoscopy 06/2005   no polyps found, next colonoscopy 2017  . Hypertension   . Intolerance of drug    biophosphates  . Osteopenia    No Known Allergies  Current Outpatient Medications:  .  hydrochlorothiazide (HYDRODIURIL) 12.5 MG tablet, Take 1 tablet (12.5 mg total) by mouth daily., Disp: 90 tablet, Rfl: 3 .  olmesartan (BENICAR) 20 MG tablet, Take 1 tablet (20 mg total) by mouth daily., Disp: 90 tablet, Rfl: 3 .  Pancrelipase, Lip-Prot-Amyl, (CREON) 24000 UNITS CPEP, Take by mouth as needed., Disp: , Rfl:   Objective: BP (!) 152/72 (BP Location: Left Arm, Patient Position: Sitting, Cuff Size: Normal)   Pulse 67   Temp 97.9 F (36.6 C) (Oral)   Ht 5' (1.524 m)   Wt 165 lb 6 oz (75 kg)   SpO2 97%   BMI 32.30 kg/m  General: Awake, appears stated age HEENT: MMM, EOMi Heart: RRR, no murmurs Lungs: CTAB, no rales, wheezes or rhonchi. No accessory muscle use Abd: BS+, soft, mild ttp in llq, ND, no masses or organomegaly Rectal: Examined in presence of female chaperone. No external lesions noted.  Sphincter of good tone.  No internal hemorrhoids or  masses appreciated.  There was some bright red blood mixed with stool noted.  Hemoccult positive. Psych: Age appropriate judgment and insight, normal affect and mood  Assessment and Plan: Melena - Plan: Ambulatory referral to Gastroenterology, CBC  Blood in stool - Plan: Ambulatory referral to Gastroenterology, CBC  Essential hypertension  Orders as above. Check CBC, refer to gastroenterology.  She likely needs a colonoscopy and possible upper endoscopy.  She is hemodynamically stable and not having any symptoms such as lightheadedness or syncope. Monitor blood pressures at home.  If continuing to be elevated, return to clinic for tailoring of her medication regimen. Follow-up pending above. The patient voiced understanding and agreement to the plan.  [place], DO 04/27/17  5:22 PM

## 2017-04-27 NOTE — Progress Notes (Signed)
Pre visit review using our clinic review tool, if applicable. No additional management support is needed unless otherwise documented below in the visit note. 

## 2017-04-27 NOTE — Patient Instructions (Addendum)
If you do not hear anything about your referral in the next 1-2 weeks, call our office and ask for an update.  We will be in touch regarding your lab results.   Monitor your blood pressures at home. If consistently elevated, return to clinic to discuss.  Let us know if you need anything.

## 2017-04-28 LAB — CBC
HCT: 38.1 % (ref 36.0–46.0)
HEMOGLOBIN: 12.8 g/dL (ref 12.0–15.0)
MCHC: 33.6 g/dL (ref 30.0–36.0)
MCV: 94.5 fl (ref 78.0–100.0)
Platelets: 217 10*3/uL (ref 150.0–400.0)
RBC: 4.04 Mil/uL (ref 3.87–5.11)
RDW: 13.7 % (ref 11.5–15.5)
WBC: 5.9 10*3/uL (ref 4.0–10.5)

## 2017-06-24 ENCOUNTER — Encounter

## 2017-06-24 ENCOUNTER — Ambulatory Visit: Payer: Medicare Other | Admitting: Gastroenterology

## 2017-07-06 ENCOUNTER — Encounter: Payer: Self-pay | Admitting: Physician Assistant

## 2017-07-06 ENCOUNTER — Encounter (INDEPENDENT_AMBULATORY_CARE_PROVIDER_SITE_OTHER): Payer: Self-pay

## 2017-07-06 ENCOUNTER — Ambulatory Visit (INDEPENDENT_AMBULATORY_CARE_PROVIDER_SITE_OTHER): Payer: Medicare Other | Admitting: Physician Assistant

## 2017-07-06 VITALS — BP 140/78 | HR 62 | Ht 60.25 in | Wt 162.3 lb

## 2017-07-06 DIAGNOSIS — Z800 Family history of malignant neoplasm of digestive organs: Secondary | ICD-10-CM | POA: Diagnosis not present

## 2017-07-06 DIAGNOSIS — K921 Melena: Secondary | ICD-10-CM

## 2017-07-06 DIAGNOSIS — R1013 Epigastric pain: Secondary | ICD-10-CM

## 2017-07-06 MED ORDER — OMEPRAZOLE 20 MG PO CPDR: 20.0000 mg | DELAYED_RELEASE_CAPSULE | Freq: Every day | ORAL | 3 refills | Status: DC

## 2017-07-06 NOTE — Patient Instructions (Signed)
You have been scheduled for an endoscopy. Please follow written instructions given to you at your visit today. If you use inhalers (even only as needed), please bring them with you on the day of your procedure. Your physician has requested that you go to www.startemmi.com and enter the access code given to you at your visit today. This web site gives a general overview about your procedure. However, you should still follow specific instructions given to you by our office regarding your preparation for the procedure.  We have sent the following medications to your pharmacy for you to pick up at your convenience: Omeprazole 20 mg daily 30-60 minutes before breakfast

## 2017-07-06 NOTE — Progress Notes (Signed)
Chief Complaint: Melena and epigastric pain  HPI:    Patricia Hansen is a 77 year old female with a past medical history as listed below, known to Dr. Ardis Hughs, who was referred to me by Shelda Pal* for a complaint of melena and epigastric pain.      04/27/2017 office visit PCP describing dark/tarry stools and bright red blood in the stool/tissue paper for the past 2 days.  CBC with a hemoglobin normal at 12.8.    08/21/2014 EGD Dr. Ardis Hughs with mild nonspecific distal gastritis and otherwise normal.  Colonoscopy same day was normal.  No further screening was recommended due to age.    Today, explains that about 3 months ago she had an episode for 2 days of some black tarry stools which then dissipated and looked like bright red blood in the toilet on 2 occasions.  She saw her PCP as above.  Started on Nexium for what sounds like 2 weeks.  She has had no further bloody stools but does report intermittent epigastric abdominal cramping not specifically worse with eating.  Also tells me that since that time every time she eats anything she has to run to the bathroom to have a looser than normal stool.    Family history significant for stomach cancer in her mother at the age of 22.    Denies fever, chills, weight loss, anorexia, nausea, vomiting or symptoms that awaken her at night.  Past Medical History:  Diagnosis Date  . Dyspepsia    chronic s/p EGD negative 2009  . History of Helicobacter infection    treated x 2  . Hx of colonoscopy 06/2005   no polyps found, next colonoscopy 2017  . Hypertension   . Intolerance of drug    biophosphates  . Osteopenia     Past Surgical History:  Procedure Laterality Date  . BREAST LUMPECTOMY  1980    Current Outpatient Medications  Medication Sig Dispense Refill  . hydrochlorothiazide (HYDRODIURIL) 12.5 MG tablet Take 1 tablet (12.5 mg total) by mouth daily. 90 tablet 3  . olmesartan (BENICAR) 20 MG tablet Take 1 tablet (20 mg total) by mouth  daily. 90 tablet 3  . Pancrelipase, Lip-Prot-Amyl, (CREON) 24000 UNITS CPEP Take by mouth as needed.     No current facility-administered medications for this visit.     Allergies as of 07/06/2017  . (No Known Allergies)    Family History  Problem Relation Age of Onset  . Uterine cancer Mother        deceased  . Hypertension Father        deceased  . Other Unknown        no history of breast cancer, diabetes or colon cancer  . Colon polyps Neg Hx   . Colon cancer Neg Hx     Social History   Socioeconomic History  . Marital status: Married    Spouse name: Not on file  . Number of children: Not on file  . Years of education: Not on file  . Highest education level: Not on file  Occupational History  . Occupation: retired  Scientific laboratory technician  . Financial resource strain: Not on file  . Food insecurity:    Worry: Not on file    Inability: Not on file  . Transportation needs:    Medical: Not on file    Non-medical: Not on file  Tobacco Use  . Smoking status: Never Smoker  . Smokeless tobacco: Never Used  Substance and Sexual Activity  .  Alcohol use: Not on file  . Drug use: Not on file  . Sexual activity: Not on file  Lifestyle  . Physical activity:    Days per week: Not on file    Minutes per session: Not on file  . Stress: Not on file  Relationships  . Social connections:    Talks on phone: Not on file    Gets together: Not on file    Attends religious service: Not on file    Active member of club or organization: Not on file    Attends meetings of clubs or organizations: Not on file    Relationship status: Not on file  . Intimate partner violence:    Fear of current or ex partner: Not on file    Emotionally abused: Not on file    Physically abused: Not on file    Forced sexual activity: Not on file  Other Topics Concern  . Not on file  Social History Narrative   Retired   Married > 3 yrs    4 daughters    Never Smoked       Alcohol use-no        Review of Systems:    Constitutional: No weight loss, fever or chills Skin: No rash  Cardiovascular: No chest pain Respiratory: No SOB Gastrointestinal: See HPI and otherwise negative Genitourinary: No dysuria  Neurological: No headache, dizziness or syncope Musculoskeletal: No new muscle or joint pain Hematologic: No bruising Psychiatric: No history of depression or anxiety   Physical Exam:  Vital signs: BP 140/78   Pulse 62   Ht 5' 0.25" (1.53 m)   Wt 162 lb 4.8 oz (73.6 kg)   BMI 31.43 kg/m    Constitutional:   Pleasant female appears to be in NAD, Well developed, Well nourished, alert and cooperative Head:  Normocephalic and atraumatic. Eyes:   PEERL, EOMI. No icterus. Conjunctiva pink. Ears:  Normal auditory acuity. Neck:  Supple Throat: Oral cavity and pharynx without inflammation, swelling or lesion.  Respiratory: Respirations even and unlabored. Lungs clear to auscultation bilaterally.   No wheezes, crackles, or rhonchi.  Cardiovascular: Normal S1, S2. No MRG. Regular rate and rhythm. No peripheral edema, cyanosis or pallor.  Gastrointestinal:  Soft, nondistended, nontender. No rebound or guarding. Normal bowel sounds. No appreciable masses or hepatomegaly. Rectal:  Not performed.  Msk:  Symmetrical without gross deformities. Without edema, no deformity or joint abnormality.  Neurologic:  Alert and  oriented x4;  grossly normal neurologically.  Skin:   Dry and intact without significant lesions or rashes. Psychiatric: Demonstrates good judgement and reason without abnormal affect or behaviors.  RELEVANT LABS AND IMAGING: CBC    Component Value Date/Time   WBC 5.9 04/27/2017 1656   RBC 4.04 04/27/2017 1656   HGB 12.8 04/27/2017 1656   HCT 38.1 04/27/2017 1656   PLT 217.0 04/27/2017 1656   MCV 94.5 04/27/2017 1656   MCHC 33.6 04/27/2017 1656   RDW 13.7 04/27/2017 1656   LYMPHSABS 1.8 07/03/2011 1452   MONOABS 0.3 07/03/2011 1452   EOSABS 0.0 07/03/2011  1452   BASOSABS 0.0 07/03/2011 1452   Assessment: 1.  Melena: 2 episodes of dark tarry stool about 3 months ago, none since, continues with intermittent epic gastric pain as below, family history of stomach cancer in her mother 78; consider gastritis versus PUD versus other 2.  Epigastric pain 3.  Family history of stomach cancer: Mother at the age of 21  Plan: 1.  Scheduled patient  for an EGD with Dr. Loletha Carrow as he had the first available appointment and patient is worried.  Did discuss risk, benefits, limitations and alternatives and the patient agrees to proceed. 2.  Prescribed Omeprazole 20 mg daily, 30-60 minutes before eating breakfast #30 with 2 refills 3.  Reviewed antireflux diet and lifestyle modifications. 4.  Reassured the patient that I do not believe she needs a repeat colonoscopy at this time.  She did just have one in 2016 and everything was normal. 5.  After procedure patient will follow with Dr. Ardis Hughs as directed.  Ellouise Newer, PA-C [facility] 07/06/2017, 8:57 AM  Cc: Shelda Pal*

## 2017-07-06 NOTE — Progress Notes (Signed)
I agree with the above note, plan 

## 2017-07-07 NOTE — Progress Notes (Signed)
I would be glad to help with my availability. Thank you for sending this note to me.

## 2017-07-14 ENCOUNTER — Telehealth: Payer: Self-pay

## 2017-07-14 ENCOUNTER — Ambulatory Visit (AMBULATORY_SURGERY_CENTER): Payer: Medicare Other | Admitting: Gastroenterology

## 2017-07-14 ENCOUNTER — Encounter: Payer: Self-pay | Admitting: Gastroenterology

## 2017-07-14 VITALS — BP 116/58 | HR 50 | Temp 98.0°F | Resp 12 | Ht 60.0 in | Wt 162.0 lb

## 2017-07-14 DIAGNOSIS — R1013 Epigastric pain: Secondary | ICD-10-CM

## 2017-07-14 DIAGNOSIS — K921 Melena: Secondary | ICD-10-CM | POA: Diagnosis not present

## 2017-07-14 MED ORDER — SODIUM CHLORIDE 0.9 % IV SOLN: 500.0000 mL | Freq: Once | INTRAVENOUS | Status: DC

## 2017-07-14 NOTE — Progress Notes (Signed)
To PACU, VSS. Report to RN.tb 

## 2017-07-14 NOTE — Telephone Encounter (Signed)
Appt made for 09/30/17 at 1:45 pm The patient has been notified of this information and all questions answered.

## 2017-07-14 NOTE — Progress Notes (Signed)
Pt's states no medical or surgical changes since previsit or office visit. 

## 2017-07-14 NOTE — Op Note (Signed)
[place] Patient Name: Patricia Hansen Procedure Date: 07/14/2017 8:32 AM MRN: [id number] Endoscopist: [place]. Loletha Carrow , MD Age: 77 Referring MD:  Date of Birth: April 20, 1940 Gender: Female Account #: [id number] Procedure:                Upper GI endoscopy Indications:              chronic epigastric abdominal pain, Melena (single                            episode > 6 weeks ago, normal Hgb), Family history                            of gastric cancer (mother) Medicines:                Monitored Anesthesia Care Procedure:                Pre-Anesthesia Assessment:                           - Prior to the procedure, a History and Physical                            was performed, and patient medications and                            allergies were reviewed. The patient's tolerance of                            previous anesthesia was also reviewed. The risks                            and benefits of the procedure and the sedation                            options and risks were discussed with the patient.                            All questions were answered, and informed consent                            was obtained. Prior Anticoagulants: The patient has                            taken no previous anticoagulant or antiplatelet                            agents. ASA Grade Assessment: II - A patient with                            mild systemic disease. After reviewing the risks                            and benefits, the patient was deemed in  satisfactory condition to undergo the procedure.                           After obtaining informed consent, the endoscope was                            passed under direct vision. Throughout the                            procedure, the patient's blood pressure, pulse, and                            oxygen saturations were monitored continuously. The                            Model GIF-[id number])  scope was introduced                            through the mouth, and advanced to the second part                            of duodenum. The upper GI endoscopy was                            accomplished without difficulty. The patient                            tolerated the procedure well. Scope In: Scope Out: Findings:                 The esophagus was normal.                           The stomach was normal.                           The cardia and gastric fundus were normal on                            retroflexion.                           The examined duodenum was normal. Complications:            No immediate complications. Estimated Blood Loss:     Estimated blood loss: none. Impression:               - Normal esophagus.                           - Normal stomach.                           - Normal examined duodenum.                           - No specimens collected. Recommendation:           - Patient  has a contact number available for                            emergencies. The signs and symptoms of potential                            delayed complications were discussed with the                            patient. Return to normal activities tomorrow.                            Written discharge instructions were provided to the                            patient.                           - Resume previous diet.                           - Continue present medications.                           - Return to see Dr. Ardis Hughs at GI office at                            appointment to be scheduled. [place]. Loletha Carrow, MD 07/14/2017 8:54:44 AM This report has been signed electronically.

## 2017-07-14 NOTE — Telephone Encounter (Signed)
-----   Message from Milus Banister, MD sent at 07/14/2017  9:05 AM EDT ----- Thanks, we'll get her back in the office. 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   Syeda Prickett, She needs my next available ROV.  Thanks  ----- Message ----- From: Doran Stabler, MD Sent: 07/14/2017   8:55 AM To: Milus Banister, MD  Normal EGD on your patient (seen by JL last week).  She needs follow up with you - she is very concerned about where the bleeding came from and wonders if she needs another colonoscopy.  Also has IBS-sounding stuff.  - HD

## 2017-07-14 NOTE — Patient Instructions (Signed)
YOU HAD AN ENDOSCOPIC PROCEDURE TODAY AT THE [facility]:   Refer to the procedure report that was given to you for any specific questions about what was found during the examination.  If the procedure report does not answer your questions, please call your gastroenterologist to clarify.  If you requested that your care partner not be given the details of your procedure findings, then the procedure report has been included in a sealed envelope for you to review at your convenience later.  YOU SHOULD EXPECT: Some feelings of bloating in the abdomen. Passage of more gas than usual.  Walking can help get rid of the air that was put into your GI tract during the procedure and reduce the bloating. If you had a lower endoscopy (such as a colonoscopy or flexible sigmoidoscopy) you may notice spotting of blood in your stool or on the toilet paper. If you underwent a bowel prep for your procedure, you may not have a normal bowel movement for a few days.  Please Note:  You might notice some irritation and congestion in your nose or some drainage.  This is from the oxygen used during your procedure.  There is no need for concern and it should clear up in a day or so.  SYMPTOMS TO REPORT IMMEDIATELY:    Following upper endoscopy (EGD)  Vomiting of blood or coffee ground material  New chest pain or pain under the shoulder blades  Painful or persistently difficult swallowing  New shortness of breath  Fever of 100F or higher  Black, tarry-looking stools  For urgent or emergent issues, a gastroenterologist can be reached at any hour by calling [phone].   DIET:  We do recommend a small meal at first, but then you may proceed to your regular diet.  Drink plenty of fluids but you should avoid alcoholic beverages for 24 hours.  MEDICATIONS: Continue present medications.  ACTIVITY:  You should plan to take it easy for the rest of today and you should NOT DRIVE or use heavy machinery until  tomorrow (because of the sedation medicines used during the test).    FOLLOW UP: Our staff will call the number listed on your records the next business day following your procedure to check on you and address any questions or concerns that you may have regarding the information given to you following your procedure. If we do not reach you, we will leave a message.  However, if you are feeling well and you are not experiencing any problems, there is no need to return our call.  We will assume that you have returned to your regular daily activities without incident.  If any biopsies were taken you will be contacted by phone or by letter within the next 1-3 weeks.  Please call us at [phone] if you have not heard about the biopsies in 3 weeks.   Thank you for allowing us to provide for your healthcare needs today.   SIGNATURES/CONFIDENTIALITY: You and/or your care partner have signed paperwork which will be entered into your electronic medical record.  These signatures attest to the fact that that the information above on your After Visit Summary has been reviewed and is understood.  Full responsibility of the confidentiality of this discharge information lies with you and/or your care-partner. 

## 2017-07-15 ENCOUNTER — Telehealth: Payer: Self-pay | Admitting: *Deleted

## 2017-07-15 ENCOUNTER — Telehealth: Payer: Self-pay

## 2017-07-15 NOTE — Telephone Encounter (Signed)
No answer. Left message to call if questions or concerns. 

## 2017-07-15 NOTE — Telephone Encounter (Signed)
Left message

## 2017-07-27 ENCOUNTER — Other Ambulatory Visit (INDEPENDENT_AMBULATORY_CARE_PROVIDER_SITE_OTHER): Payer: Medicare Other

## 2017-07-27 DIAGNOSIS — I10 Essential (primary) hypertension: Secondary | ICD-10-CM

## 2017-07-27 DIAGNOSIS — M810 Age-related osteoporosis without current pathological fracture: Secondary | ICD-10-CM

## 2017-07-27 LAB — COMPREHENSIVE METABOLIC PANEL
ALT: 17 U/L (ref 0–35)
AST: 14 U/L (ref 0–37)
Albumin: 4 g/dL (ref 3.5–5.2)
Alkaline Phosphatase: 35 U/L — ABNORMAL LOW (ref 39–117)
BUN: 14 mg/dL (ref 6–23)
CALCIUM: 8.7 mg/dL (ref 8.4–10.5)
CHLORIDE: 105 meq/L (ref 96–112)
CO2: 30 meq/L (ref 19–32)
CREATININE: 0.76 mg/dL (ref 0.40–1.20)
GFR: 78.74 mL/min (ref >60.00)
Glucose, Bld: 98 mg/dL (ref 70–99)
Potassium: 4.3 mEq/L (ref 3.5–5.1)
Sodium: 141 mEq/L (ref 135–145)
Total Bilirubin: 0.5 mg/dL (ref 0.2–1.2)
Total Protein: 6.7 g/dL (ref 6.0–8.3)

## 2017-07-27 LAB — CBC
HEMATOCRIT: 37.5 % (ref 36.0–46.0)
HEMOGLOBIN: 12.5 g/dL (ref 12.0–15.0)
MCHC: 33.3 g/dL (ref 30.0–36.0)
MCV: 94 fl (ref 78.0–100.0)
Platelets: 203 10*3/uL (ref 150.0–400.0)
RBC: 3.99 Mil/uL (ref 3.87–5.11)
RDW: 13.3 % (ref 11.5–15.5)
WBC: 4.4 10*3/uL (ref 4.0–10.5)

## 2017-07-27 LAB — VITAMIN D 25 HYDROXY (VIT D DEFICIENCY, FRACTURES): VITD: 62.34 ng/mL (ref 30.00–100.00)

## 2017-07-27 LAB — LIPID PANEL
CHOL/HDL RATIO: 3
Cholesterol: 180 mg/dL (ref 0–200)
HDL: 58.1 mg/dL (ref >39.00)
LDL CALC: 95 mg/dL (ref 0–99)
NonHDL: 121.46
Triglycerides: 130 mg/dL (ref 0.0–149.0)
VLDL: 26 mg/dL (ref 0.0–40.0)

## 2017-07-31 ENCOUNTER — Encounter: Payer: Self-pay | Admitting: Family Medicine

## 2017-08-03 ENCOUNTER — Encounter: Payer: Self-pay | Admitting: Family Medicine

## 2017-08-03 ENCOUNTER — Ambulatory Visit (INDEPENDENT_AMBULATORY_CARE_PROVIDER_SITE_OTHER): Payer: Medicare Other | Admitting: Family Medicine

## 2017-08-03 ENCOUNTER — Ambulatory Visit: Payer: Medicare Other | Admitting: Family Medicine

## 2017-08-03 VITALS — BP 112/62 | HR 66 | Temp 98.0°F | Ht 60.0 in | Wt 163.2 lb

## 2017-08-03 DIAGNOSIS — Z0000 Encounter for general adult medical examination without abnormal findings: Secondary | ICD-10-CM | POA: Diagnosis not present

## 2017-08-03 MED ORDER — OLMESARTAN MEDOXOMIL 20 MG PO TABS: 20.0000 mg | ORAL_TABLET | Freq: Every day | ORAL | 3 refills | Status: DC

## 2017-08-03 NOTE — Progress Notes (Signed)
Pre visit review using our clinic review tool, if applicable. No additional management support is needed unless otherwise documented below in the visit note. 

## 2017-08-03 NOTE — Patient Instructions (Addendum)
OK to use Debrox (peroxide) in the ear to loosen up wax. Also recommend using a bulb syringe (for removing boogers from baby's noses) to flush through warm water. Do not use Q-tips as this can impact wax further.  If you need a referral to the gynecologist, let me know. I don't think so.   Keep active and stay eating healthy.  Let us know if you need anything.

## 2017-08-03 NOTE — Progress Notes (Signed)
Subjective:    Patricia Hansen is a 77 y.o. female who presents for Medicare Initial preventive examination.  Preventive Screening-Counseling & Management  Tobacco Social History   Tobacco Use  Smoking Status Never Smoker  Smokeless Tobacco Never Used     Problems Prior to Visit 1. See below  Current Problems (verified) Patient Active Problem List   Diagnosis Date Noted  . Allergic dermatitis 07/08/2011  . ALLERGIC RHINITIS 01/18/2009  . Essential hypertension 10/04/2007  . GERD 10/04/2007  . Osteoporosis 07/28/2007   Medications Prior to Visit Current Outpatient Medications on File Prior to Visit  Medication Sig Dispense Refill  . esomeprazole (NEXIUM) 20 MG capsule Take 20 mg by mouth daily at 12 noon.    . hydrochlorothiazide (HYDRODIURIL) 12.5 MG tablet Take 1 tablet (12.5 mg total) by mouth daily. 90 tablet 3  . omeprazole (PRILOSEC) 20 MG capsule Take 1 capsule (20 mg total) by mouth daily. 90 capsule 3  . Pancrelipase, Lip-Prot-Amyl, (CREON) 24000 UNITS CPEP Take by mouth as needed.     Current Medications (verified) Current Outpatient Medications  Medication Sig Dispense Refill  . esomeprazole (NEXIUM) 20 MG capsule Take 20 mg by mouth daily at 12 noon.    . hydrochlorothiazide (HYDRODIURIL) 12.5 MG tablet Take 1 tablet (12.5 mg total) by mouth daily. 90 tablet 3  . olmesartan (BENICAR) 20 MG tablet Take 1 tablet (20 mg total) by mouth daily. 90 tablet 3  . omeprazole (PRILOSEC) 20 MG capsule Take 1 capsule (20 mg total) by mouth daily. 90 capsule 3  . Pancrelipase, Lip-Prot-Amyl, (CREON) 24000 UNITS CPEP Take by mouth as needed.     Allergies (verified) Patient has no known allergies.   PAST HISTORY  Family History Family History  Problem Relation Age of Onset  . Uterine cancer Mother        deceased  . Hypertension Father        deceased  . Other Unknown        no history of breast cancer, diabetes or colon cancer  . Colon polyps Neg Hx   . Colon cancer  Neg Hx     Social History Social History   Tobacco Use  . Smoking status: Never Smoker  . Smokeless tobacco: Never Used  Substance Use Topics  . Alcohol use: Not on file     Are there smokers in your home (other than you)? No  Risk Factors Current exercise habits: golf  Dietary issues discussed: Yes   Cardiac risk factors: advanced age (older than 38 for men, 43 for women), hypertension and obesity (BMI >= 30 kg/m2).  Depression Screen (Note: if answer to either of the following is "Yes", a more complete depression screening is indicated)   Over the past 2 weeks, have you felt down, depressed or hopeless? No  Over the past 2 weeks, have you felt little interest or pleasure in doing things? No  Have you lost interest or pleasure in daily life? No  Do you often feel hopeless? No  Do you cry easily over simple problems? No  Activities of Daily Living In your present state of health, do you have any difficulty performing the following activities?:  Driving? No Managing money?  No Feeding yourself? No Getting from bed to chair? No Climbing a flight of stairs? No Preparing food and eating?: No Bathing or showering? No Getting dressed: No Getting to the toilet? No Using the toilet:No Moving around from place to place: No In the past year  have you fallen or had a near fall?:No  Do you have more than one partner?  No  Hearing Difficulties: No Do you often ask people to speak up or repeat themselves? No Do you experience ringing or noises in your ears? No Do you have difficulty understanding soft or whispered voices? No   Do you feel that you have a problem with memory? No  Do you often misplace items? No  Do you feel safe at home?  Yes  Cognitive Testing  Alert? Yes  Normal Appearance?Yes  Oriented to person? Yes  Place? Yes   Time? Yes  Recall of three objects?  Yes  Can perform simple calculations? Yes  Displays appropriate judgment?Yes  Can read the correct time  from a watch face?Yes  Advanced Directives have been discussed with the patient? Yes  List the Names of Other Physician/Practitioners you currently use: 1.  Dr. Fabiola Backer, [facility]  Indicate any recent Medical Services you may have received from other than [facility] providers in the past year (date may be approximate).  Immunization History  Administered Date(s) Administered  . Influenza Split 10/16/2010, 09/22/2011  . Influenza Whole 10/04/2007, 10/02/2008, 09/07/2009  . Influenza-Unspecified 10/20/2014, 09/13/2015, 09/15/2016  . Pneumococcal Conjugate-13 05/13/2016  . Pneumococcal Polysaccharide-23 07/28/2007, 10/25/2009  . Td 12/13/2007  . Zoster 10/26/2008    Screening Tests Health Maintenance  Topic Date Due  . INFLUENZA VACCINE  08/06/2017  . TETANUS/TDAP  12/12/2017  . COLONOSCOPY  08/20/2024  . DEXA SCAN  Completed  . PNA vac Low Risk Adult  Completed    All answers were reviewed with the patient and necessary referrals were made:  History reviewed: allergies, current medications, past family history, past medical history, past social history, past surgical history and problem list  Review of Systems 10 pt ROS neg   Objective:      Body mass index is 31.88 kg/m. BP 112/62 (BP Location: Left Arm, Patient Position: Sitting, Cuff Size: Normal)   Pulse 66   Temp 98 F (36.7 C) (Oral)   Ht 5' (1.524 m)   Wt 163 lb 4 oz (74 kg)   SpO2 94%   BMI 31.88 kg/m   BP 112/62 (BP Location: Left Arm, Patient Position: Sitting, Cuff Size: Normal)   Pulse 66   Temp 98 F (36.7 C) (Oral)   Ht 5' (1.524 m)   Wt 163 lb 4 oz (74 kg)   SpO2 94%   BMI 31.88 kg/m   General Appearance:    Alert, cooperative, no distress, appears stated age  Head:    Normocephalic, without obvious abnormality, atraumatic  Eyes:    PERRL, conjunctiva/corneas clear, EOM's intact, fundi    benign, both eyes  Ears:    Normal TM's and external ear canals, both ears  Nose:    Nares normal, septum midline, mucosa normal, no drainage    or sinus tenderness  Throat:   Lips, mucosa, and tongue normal; teeth and gums normal  Neck:   Supple, symmetrical, trachea midline, no adenopathy;    thyroid:  no enlargement/tenderness/nodules; no carotid   bruit or JVD  Back:     Symmetric, no curvature, ROM normal, no CVA tenderness  Lungs:     Clear to auscultation bilaterally, respirations unlabored  Chest Wall:    No tenderness or deformity   Heart:    Regular rate and rhythm, S1 and S2 normal, no murmur, rub   or gallop  Abdomen:     Soft,  non-tender, bowel sounds active all four quadrants,    no masses, no organomegaly  Extremities:   Extremities normal, atraumatic, no cyanosis or edema  Pulses:   2+ and symmetric all extremities  Skin:   Skin color, texture, turgor normal, no rashes or lesions  Lymph nodes:   Cervical, supraclavicular, and axillary nodes normal  Neurologic:   CNII-XII intact, normal strength, sensation and reflexes    throughout       Assessment:     Medicare annual wellness visit, initial     Plan:     During the course of the visit the patient was educated and counseled about appropriate screening and preventive services including:    counseled on diet and exercise  Diet review for nutrition referral? Not Indicated   Patient Instructions (the written plan) was given to the patient.  Medicare Attestation I have personally reviewed: The patient's medical and social history Their use of alcohol, tobacco or illicit drugs Their current medications and supplements The patient's functional ability including ADLs,fall risks, home safety risks, cognitive, and hearing and visual impairment Diet and physical activities Evidence for depression or mood disorders  The patient's weight, height, BMI, and visual acuity have been recorded in the chart.  I have made referrals, counseling, and provided education to the patient based on review of the  above and I have provided the patient with a written personalized care plan for preventive services.      [place], DO   08/03/2017

## 2017-08-10 ENCOUNTER — Ambulatory Visit (INDEPENDENT_AMBULATORY_CARE_PROVIDER_SITE_OTHER): Payer: Medicare Other | Admitting: Obstetrics & Gynecology

## 2017-08-10 ENCOUNTER — Encounter: Payer: Self-pay | Admitting: Obstetrics & Gynecology

## 2017-08-10 ENCOUNTER — Encounter (HOSPITAL_BASED_OUTPATIENT_CLINIC_OR_DEPARTMENT_OTHER): Payer: Self-pay

## 2017-08-10 ENCOUNTER — Ambulatory Visit (HOSPITAL_BASED_OUTPATIENT_CLINIC_OR_DEPARTMENT_OTHER)
Admission: RE | Admit: 2017-08-10 | Discharge: 2017-08-10 | Disposition: A | Discharge status: Home / Self Care | Payer: Medicare Other | Source: Ambulatory Visit | Attending: Obstetrics & Gynecology | Admitting: Obstetrics & Gynecology

## 2017-08-10 VITALS — BP 102/81 | HR 70 | Ht 60.0 in | Wt 164.0 lb

## 2017-08-10 DIAGNOSIS — N950 Postmenopausal bleeding: Secondary | ICD-10-CM | POA: Diagnosis not present

## 2017-08-10 NOTE — Progress Notes (Signed)
   GYNECOLOGY OFFICE VISIT NOTE  History:  77 y.o. [id number] here today for evaluation of one episode of bleeding noted on April 25, 2017. Unsure of etiology.  Has been menopausal for about two decades, no postmenopausal bleeding (PMB) since then.  Bleeding was noticed after using restroom, was bright red.  PCP suspected possible rectal bleeding and referred to GI, they did upper endoscopy which was normal, no lower GI studies (had normal colonoscopy in 2016).  No further bleeding since then but she is worried about possible uterine cancer and wants evaluation. She denies any abnormal vaginal discharge, bleeding, pelvic pain or other concerns.   Past Medical History:  Diagnosis Date  . Dyspepsia    chronic s/p EGD negative 2009  . History of Helicobacter infection    treated x 2  . Hx of colonoscopy 06/2005   no polyps found, next colonoscopy 2017  . Hypertension   . Intolerance of drug    biophosphates  . Osteopenia     Past Surgical History:  Procedure Laterality Date  . BREAST LUMPECTOMY  1980    The following portions of the patient's history were reviewed and updated as appropriate: allergies, current medications, past family history, past medical history, past social history, past surgical history and problem list.   Health Maintenance:  Normal paps in the past, last one was 5 years ago.  Normal mammogram on 02/12/2017.   Review of Systems:  Pertinent items noted in HPI and remainder of comprehensive ROS otherwise negative.  Objective:  Physical Exam BP 102/81   Pulse 70   Ht 5' (1.524 m)   Wt 164 lb (74.4 kg)   BMI 32.03 kg/m  CONSTITUTIONAL: Well-developed, well-nourished female in no acute distress.  HEENT:  Normocephalic, atraumatic. External right and left ear normal. No scleral icterus.  NECK: Normal range of motion, supple, no masses noted on observation SKIN: Skin is warm and dry. No rash noted. Not diaphoretic. No erythema. No pallor. MUSCULOSKELETAL: Normal  range of motion. No edema noted. NEUROLOGIC: Alert and oriented to person, place, and time. Normal muscle tone coordination. No cranial nerve deficit noted. PSYCHIATRIC: Normal mood and affect. Normal behavior. Normal judgment and thought content. CARDIOVASCULAR: Normal heart rate noted RESPIRATORY: Effort and breath sounds normal, no problems with respiration noted ABDOMEN: Soft, no distention noted.   PELVIC: Normal appearing external genitalia; normal appearing vaginal mucosa and cervix with moderate atrophy. Stage I-II cystocele and rectocele present.  No abnormal discharge noted.  Normal uterine size, no other palpable masses, no uterine or adnexal tenderness. No skin lesions.    Assessment & Plan:  1. Concern for postmenopausal bleeding Normal exam, unclear if this is of GYN etiology. Will get ultrasound as requested by patient.  If recurs, will need reevaluation of bladder, uterus and rectum/colon (three systems that can cause bleeding noted from pelvic area other than skin lesions).  Patient agrees with this plan. - US PELVIC COMPLETE WITH TRANSVAGINAL; Future  Return for any gynecologic concerns.   Total face-to-face time with patient: 20 minutes.  Over 50% of encounter was spent on counseling and coordination of care.   Verita Schneiders, MD, [place] for [employer], [place]

## 2017-08-10 NOTE — Progress Notes (Signed)
Patient reports some bleeding on April 25 2017. Patient states she is unsure where the bleeding was coming from since she only noticed when using restroom. Patient has had GI appt for rectal bleeding and wants to be sure GYN is ok since her mother had uterine/stomach cancer. Patricia Hansen

## 2017-08-13 ENCOUNTER — Telehealth: Payer: Self-pay

## 2017-08-13 NOTE — Telephone Encounter (Signed)
-----   Message from Osborne Oman, MD sent at 08/12/2017  2:02 PM EDT ----- Please inform patient of negative ultrasound results and assure her there are no signs of neoplasia. Also endometrium is really thin, unlikely that bleeding came from this, but even if it did, it will be due to atrophy which is benign.

## 2017-08-13 NOTE — Telephone Encounter (Signed)
Patient called and made aware that her ultrasound was normal. Patient made aware that Dr. Harolyn Rutherford thinks it is unlikely her bleeding came from here but if it did was likely due to atrophy.  Patient states understanding. Kathrene Alu RN

## 2017-08-20 ENCOUNTER — Ambulatory Visit (INDEPENDENT_AMBULATORY_CARE_PROVIDER_SITE_OTHER): Payer: Medicare Other

## 2017-08-20 DIAGNOSIS — Z23 Encounter for immunization: Secondary | ICD-10-CM

## 2017-08-25 DIAGNOSIS — S42001A Fracture of unspecified part of right clavicle, initial encounter for closed fracture: Secondary | ICD-10-CM | POA: Diagnosis not present

## 2017-08-25 DIAGNOSIS — R52 Pain, unspecified: Secondary | ICD-10-CM | POA: Diagnosis not present

## 2017-08-25 DIAGNOSIS — I10 Essential (primary) hypertension: Secondary | ICD-10-CM | POA: Diagnosis not present

## 2017-08-25 DIAGNOSIS — R609 Edema, unspecified: Secondary | ICD-10-CM | POA: Diagnosis not present

## 2017-08-25 DIAGNOSIS — M19011 Primary osteoarthritis, right shoulder: Secondary | ICD-10-CM | POA: Diagnosis not present

## 2017-08-25 DIAGNOSIS — M25519 Pain in unspecified shoulder: Secondary | ICD-10-CM | POA: Diagnosis not present

## 2017-08-25 DIAGNOSIS — S42031A Displaced fracture of lateral end of right clavicle, initial encounter for closed fracture: Secondary | ICD-10-CM | POA: Diagnosis not present

## 2017-08-26 DIAGNOSIS — S42031A Displaced fracture of lateral end of right clavicle, initial encounter for closed fracture: Secondary | ICD-10-CM | POA: Diagnosis not present

## 2017-09-10 DIAGNOSIS — R52 Pain, unspecified: Secondary | ICD-10-CM | POA: Diagnosis not present

## 2017-09-10 DIAGNOSIS — S42031A Displaced fracture of lateral end of right clavicle, initial encounter for closed fracture: Secondary | ICD-10-CM | POA: Diagnosis not present

## 2017-09-30 ENCOUNTER — Encounter: Payer: Self-pay | Admitting: Gastroenterology

## 2017-09-30 ENCOUNTER — Ambulatory Visit (INDEPENDENT_AMBULATORY_CARE_PROVIDER_SITE_OTHER): Payer: Medicare Other | Admitting: Gastroenterology

## 2017-09-30 VITALS — BP 128/70 | HR 74 | Ht 60.0 in | Wt 166.0 lb

## 2017-09-30 DIAGNOSIS — K625 Hemorrhage of anus and rectum: Secondary | ICD-10-CM

## 2017-09-30 MED ORDER — PEG 3350-KCL-NA BICARB-NACL 420 G PO SOLR: 4000.0000 mL | ORAL | 0 refills | Status: DC

## 2017-09-30 NOTE — Progress Notes (Signed)
Review of pertinent gastrointestinal problems: 1. Variety of UGI symptoms over many years and also FH of gastric cancer. EGD 2009 Dr. Deatra Ina for dysphagia, abd pain; maloney dilated mild GE junction stricture; biopsied slightly duodenum and it was normal. . 08/21/2014 EGD Dr. Ardis Hughs for dyspepsia with mild nonspecific distal gastritis and otherwise normal.  EGD 07/2017 Dr. Loletha Carrow (for melena single episode, normal Hb)  was normal  2. Routine risk for colon cancer. Colonoscopy 05/2005 was normal. Colonoscopy 08/2014 Dr. Ardis Hughs was normal.  No further screening was recommended due to age. 3. Pancreatic insufficiency?: had been on panc enzymes for years prior to establishing with Dr. Ardis Hughs 2011, unclear why however she was clear that they helped her dypepsia. CT scan 2011 showed normal pancreas.  HPI: This is a 77 year old woman whom I last saw 2 or 3 years ago at the time of colonoscopy.  In the interim she was here 2 or 3 months ago complaining of some dark stools and ended up getting an upper endoscopy with Dr. Wilfrid Lund, the EGD was normal.   Today she tells me that in April she had loose stools and she saw right red blood in her bowel movement.  This happened twice during 1 day.  She has had no overt bleeding since then.  Her weight has been overall stable.  In examination by her primary care physician very shortly after she saw the blood was normal.  She tells me no hemorrhoids were seen.   Chief complaint is rectal bleeding  ROS: complete GI ROS as described in HPI, all other review negative.  Constitutional:  No unintentional weight loss   Past Medical History:  Diagnosis Date  . Dyspepsia    chronic s/p EGD negative 2009  . History of Helicobacter infection    treated x 2  . Hx of colonoscopy 06/2005   no polyps found, next colonoscopy 2017  . Hypertension   . Intolerance of drug    biophosphates  . Osteopenia     Past Surgical History:  Procedure Laterality Date  . BREAST  LUMPECTOMY Right 1980   Benign per patient  . TUBAL LIGATION      Current Outpatient Medications  Medication Sig Dispense Refill  . hydrochlorothiazide (HYDRODIURIL) 12.5 MG tablet Take 1 tablet (12.5 mg total) by mouth daily. 90 tablet 3  . olmesartan (BENICAR) 20 MG tablet Take 1 tablet (20 mg total) by mouth daily. 90 tablet 3  . Pancrelipase, Lip-Prot-Amyl, (CREON) 24000 UNITS CPEP Take by mouth as needed.     No current facility-administered medications for this visit.     Allergies as of 09/30/2017  . (No Known Allergies)    Family History  Problem Relation Age of Onset  . Uterine cancer Mother        deceased  . Hypertension Father        deceased  . Other Unknown        no history of breast cancer, diabetes or colon cancer  . Colon polyps Neg Hx   . Colon cancer Neg Hx     Social History   Socioeconomic History  . Marital status: Married    Spouse name: Not on file  . Number of children: Not on file  . Years of education: Not on file  . Highest education level: Not on file  Occupational History  . Occupation: retired  Scientific laboratory technician  . Financial resource strain: Not on file  . Food insecurity:    Worry: Not  on file    Inability: Not on file  . Transportation needs:    Medical: Not on file    Non-medical: Not on file  Tobacco Use  . Smoking status: Never Smoker  . Smokeless tobacco: Never Used  Substance and Sexual Activity  . Alcohol use: Never    Frequency: Never  . Drug use: Never  . Sexual activity: Never  Lifestyle  . Physical activity:    Days per week: Not on file    Minutes per session: Not on file  . Stress: Not on file  Relationships  . Social connections:    Talks on phone: Not on file    Gets together: Not on file    Attends religious service: Not on file    Active member of club or organization: Not on file    Attends meetings of clubs or organizations: Not on file    Relationship status: Not on file  . Intimate partner violence:     Fear of current or ex partner: Not on file    Emotionally abused: Not on file    Physically abused: Not on file    Forced sexual activity: Not on file  Other Topics Concern  . Not on file  Social History Narrative   Retired   Married > 40 yrs    4 daughters    Never Smoked       Alcohol use-no        Physical Exam: BP 128/70   Pulse 74   Ht 5' (1.524 m)   Wt 166 lb (75.3 kg)   BMI 32.42 kg/m  Constitutional: generally well-appearing Psychiatric: alert and oriented x3 Abdomen: soft, nontender, nondistended, no obvious ascites, no peritoneal signs, normal bowel sounds No peripheral edema noted in lower extremities Rectal exam deferred for upcoming colonoscopy  Assessment and plan: 77 y.o. female with limited rectal bleeding, 5 months ago  I told her I think that her minor anal rectal bleeding is probably from hemorrhoids.  None were seen around the time of her bleeding but that does not mean she did not have internal hemorrhoids.  I explained to her that hemorrhoids can come and go.  She redirected the conversation several times to the idea that she should have a colonoscopy.  Explained to her that I think it is very unlikely that she has anything serious going on her colon but she told me that if she was proven to have hemorrhoids and she would still want a colonoscopy to make sure that nothing else was going on.  She understands that there are risks to colonoscopy including perforation, bleeding missing a cancer cardiopulmonary events related to sedation.  With that in mind she would like to go ahead with a repeat colonoscopy.  I see no reason for any further blood tests or imaging studies prior to then.  Please see the "Patient Instructions" section for addition details about the plan.  Owens Loffler, MD [facility] 09/30/2017, 1:46 PM

## 2017-09-30 NOTE — Patient Instructions (Addendum)
You will be set up for a colonoscopy for rectal bleeding.  Normal BMI (Body Mass Index- based on height and weight) is between 23 and 30. Your BMI today is Body mass index is 32.42 kg/m. Marland Kitchen Please consider follow up  regarding your BMI with your Primary Care Provider.

## 2017-10-01 ENCOUNTER — Telehealth: Payer: Self-pay

## 2017-10-01 NOTE — Telephone Encounter (Signed)
[employer] benefits received PA is not required    Patient may owe approximately $0.00 OOP  Patient due after 10/26/17  Letter mailed to inform patient of benefits and to schedule

## 2017-10-07 DIAGNOSIS — S42022D Displaced fracture of shaft of left clavicle, subsequent encounter for fracture with routine healing: Secondary | ICD-10-CM | POA: Diagnosis not present

## 2017-10-07 DIAGNOSIS — M25511 Pain in right shoulder: Secondary | ICD-10-CM | POA: Diagnosis not present

## 2017-10-09 ENCOUNTER — Ambulatory Visit: Payer: Medicare Other

## 2017-10-12 ENCOUNTER — Ambulatory Visit (INDEPENDENT_AMBULATORY_CARE_PROVIDER_SITE_OTHER): Payer: Medicare Other

## 2017-10-12 DIAGNOSIS — M818 Other osteoporosis without current pathological fracture: Secondary | ICD-10-CM

## 2017-10-12 DIAGNOSIS — Z23 Encounter for immunization: Secondary | ICD-10-CM

## 2017-10-12 MED ORDER — DENOSUMAB 60 MG/ML ~~LOC~~ SOSY
60.0000 mg | PREFILLED_SYRINGE | Freq: Once | SUBCUTANEOUS | Status: AC
  Administered 2017-10-12: 60 mg via SUBCUTANEOUS

## 2017-10-13 ENCOUNTER — Ambulatory Visit: Payer: Medicare Other

## 2017-10-15 DIAGNOSIS — H04123 Dry eye syndrome of bilateral lacrimal glands: Secondary | ICD-10-CM | POA: Diagnosis not present

## 2017-10-27 DIAGNOSIS — M6281 Muscle weakness (generalized): Secondary | ICD-10-CM | POA: Diagnosis not present

## 2017-10-27 DIAGNOSIS — M25511 Pain in right shoulder: Secondary | ICD-10-CM | POA: Diagnosis not present

## 2017-10-29 ENCOUNTER — Ambulatory Visit: Payer: Medicare Other

## 2017-11-06 DIAGNOSIS — S42021D Displaced fracture of shaft of right clavicle, subsequent encounter for fracture with routine healing: Secondary | ICD-10-CM | POA: Diagnosis not present

## 2017-11-18 ENCOUNTER — Ambulatory Visit (AMBULATORY_SURGERY_CENTER): Payer: Medicare Other | Admitting: Gastroenterology

## 2017-11-18 ENCOUNTER — Encounter: Payer: Self-pay | Admitting: Gastroenterology

## 2017-11-18 VITALS — BP 94/55 | HR 52 | Temp 96.6°F | Resp 11 | Ht 60.0 in | Wt 166.0 lb

## 2017-11-18 DIAGNOSIS — K649 Unspecified hemorrhoids: Secondary | ICD-10-CM

## 2017-11-18 DIAGNOSIS — D122 Benign neoplasm of ascending colon: Secondary | ICD-10-CM

## 2017-11-18 DIAGNOSIS — K625 Hemorrhage of anus and rectum: Secondary | ICD-10-CM

## 2017-11-18 DIAGNOSIS — Z1211 Encounter for screening for malignant neoplasm of colon: Secondary | ICD-10-CM | POA: Diagnosis not present

## 2017-11-18 DIAGNOSIS — K635 Polyp of colon: Secondary | ICD-10-CM | POA: Diagnosis not present

## 2017-11-18 MED ORDER — SODIUM CHLORIDE 0.9 % IV SOLN: 500.0000 mL | Freq: Once | INTRAVENOUS | Status: DC

## 2017-11-18 NOTE — Progress Notes (Signed)
To PACU VSS. Report to [email or web address] 

## 2017-11-18 NOTE — Progress Notes (Signed)
Called to room to assist during endoscopic procedure.  Patient ID and intended procedure confirmed with present staff. Received instructions for my participation in the procedure from the performing physician.  

## 2017-11-18 NOTE — Op Note (Signed)
[place] Patient Name: Patricia Hansen Procedure Date: 11/18/2017 9:07 AM MRN: [id number] Endoscopist: Milus Banister , MD Age: 77 Referring MD:  Date of Birth: 21-Jul-1940 Gender: Female Account #: [id number] Procedure:                Colonoscopy Indications:              Rectal bleeding; Colonoscopy 05/2005 was normal.                            Colonoscopy 08/2014 Dr. Ardis Hughs was normal. Medicines:                Monitored Anesthesia Care Procedure:                Pre-Anesthesia Assessment:                           - Prior to the procedure, a History and Physical                            was performed, and patient medications and                            allergies were reviewed. The patient's tolerance of                            previous anesthesia was also reviewed. The risks                            and benefits of the procedure and the sedation                            options and risks were discussed with the patient.                            All questions were answered, and informed consent                            was obtained. Prior Anticoagulants: The patient has                            taken no previous anticoagulant or antiplatelet                            agents. ASA Grade Assessment: II - A patient with                            mild systemic disease. After reviewing the risks                            and benefits, the patient was deemed in                            satisfactory condition to undergo the procedure.  After obtaining informed consent, the colonoscope                            was passed under direct vision. Throughout the                            procedure, the patient's blood pressure, pulse, and                            oxygen saturations were monitored continuously. The                            Colonoscope was introduced through the anus and                            advanced to the the  cecum, identified by                            appendiceal orifice and ileocecal valve. The                            colonoscopy was performed without difficulty. The                            patient tolerated the procedure well. The quality                            of the bowel preparation was good. The ileocecal                            valve, appendiceal orifice, and rectum were                            photographed. Scope In: 9:14:51 AM Scope Out: 9:23:51 AM Scope Withdrawal Time: 0 hours 6 minutes 42 seconds  Total Procedure Duration: 0 hours 9 minutes 0 seconds  Findings:                 A 2 mm polyp was found in the ascending colon. The                            polyp was sessile. The polyp was removed with a                            cold snare. Resection and retrieval were complete.                           Internal hemorrhoids were found. The hemorrhoids                            were small.                           The exam was otherwise without abnormality on  direct and retroflexion views. Complications:            No immediate complications. Estimated blood loss:                            None. Estimated Blood Loss:     Estimated blood loss: none. Impression:               - One 2 mm polyp in the ascending colon, removed                            with a cold snare. Resected and retrieved.                           - Internal hemorrhoids.                           - The examination was otherwise normal on direct                            and retroflexion views. Recommendation:           - Patient has a contact number available for                            emergencies. The signs and symptoms of potential                            delayed complications were discussed with the                            patient. Return to normal activities tomorrow.                            Written discharge instructions were provided to the                             patient.                           - Resume previous diet.                           - Continue present medications.                           You will receive a letter within 2-3 weeks with the                            pathology results and my final recommendations.                           If the polyp(s) is proven to be 'pre-cancerous' on                            pathology, you will need repeat colonoscopy in 5  years. Milus Banister, MD 11/18/2017 9:27:10 AM This report has been signed electronically.

## 2017-11-18 NOTE — Patient Instructions (Signed)
Handouts provided:  Polyps  YOU HAD AN ENDOSCOPIC PROCEDURE TODAY AT THE [facility]:   Refer to the procedure report that was given to you for any specific questions about what was found during the examination.  If the procedure report does not answer your questions, please call your gastroenterologist to clarify.  If you requested that your care partner not be given the details of your procedure findings, then the procedure report has been included in a sealed envelope for you to review at your convenience later.  YOU SHOULD EXPECT: Some feelings of bloating in the abdomen. Passage of more gas than usual.  Walking can help get rid of the air that was put into your GI tract during the procedure and reduce the bloating. If you had a lower endoscopy (such as a colonoscopy or flexible sigmoidoscopy) you may notice spotting of blood in your stool or on the toilet paper. If you underwent a bowel prep for your procedure, you may not have a normal bowel movement for a few days.  Please Note:  You might notice some irritation and congestion in your nose or some drainage.  This is from the oxygen used during your procedure.  There is no need for concern and it should clear up in a day or so.  SYMPTOMS TO REPORT IMMEDIATELY:   Following lower endoscopy (colonoscopy or flexible sigmoidoscopy):  Excessive amounts of blood in the stool  Significant tenderness or worsening of abdominal pains  Swelling of the abdomen that is new, acute  Fever of 100F or higher  For urgent or emergent issues, a gastroenterologist can be reached at any hour by calling [phone].   DIET:  We do recommend a small meal at first, but then you may proceed to your regular diet.  Drink plenty of fluids but you should avoid alcoholic beverages for 24 hours.  ACTIVITY:  You should plan to take it easy for the rest of today and you should NOT DRIVE or use heavy machinery until tomorrow (because of the sedation  medicines used during the test).    FOLLOW UP: Our staff will call the number listed on your records the next business day following your procedure to check on you and address any questions or concerns that you may have regarding the information given to you following your procedure. If we do not reach you, we will leave a message.  However, if you are feeling well and you are not experiencing any problems, there is no need to return our call.  We will assume that you have returned to your regular daily activities without incident.  If any biopsies were taken you will be contacted by phone or by letter within the next 1-3 weeks.  Please call us at [phone] if you have not heard about the biopsies in 3 weeks.    SIGNATURES/CONFIDENTIALITY: You and/or your care partner have signed paperwork which will be entered into your electronic medical record.  These signatures attest to the fact that that the information above on your After Visit Summary has been reviewed and is understood.  Full responsibility of the confidentiality of this discharge information lies with you and/or your care-partner.  

## 2017-11-19 ENCOUNTER — Telehealth: Payer: Self-pay | Admitting: *Deleted

## 2017-11-19 NOTE — Telephone Encounter (Signed)
  Follow up Call-  Call back number 11/18/2017 07/14/2017  Post procedure Call Back phone  # [phone]  Permission to leave phone message Yes Yes  Some recent data might be hidden     Patient questions:  Do you have a fever, pain , or abdominal swelling? No. Pain Score  0 *  Have you tolerated food without any problems? Yes.    Have you been able to return to your normal activities? Yes.    Do you have any questions about your discharge instructions: Diet   No. Medications  No. Follow up visit  No.  Do you have questions or concerns about your Care? No.  Actions: * If pain score is 4 or above: No action needed, pain <4.

## 2017-11-23 ENCOUNTER — Encounter: Payer: Self-pay | Admitting: Gastroenterology

## 2017-12-09 ENCOUNTER — Other Ambulatory Visit: Payer: Self-pay | Admitting: Family Medicine

## 2018-01-25 ENCOUNTER — Telehealth: Payer: Self-pay | Admitting: *Deleted

## 2018-01-25 DIAGNOSIS — I10 Essential (primary) hypertension: Secondary | ICD-10-CM

## 2018-01-25 NOTE — Telephone Encounter (Signed)
Pt is scheduled for lab appointment on 01/28/18 but I do not see any future orders in Epic.  Please advise or place appropriate orders?

## 2018-01-26 NOTE — Telephone Encounter (Signed)
Orders placed. TY for catching.

## 2018-01-28 ENCOUNTER — Encounter: Payer: Self-pay | Admitting: Family Medicine

## 2018-01-28 ENCOUNTER — Other Ambulatory Visit (INDEPENDENT_AMBULATORY_CARE_PROVIDER_SITE_OTHER): Payer: Medicare Other

## 2018-01-28 DIAGNOSIS — I10 Essential (primary) hypertension: Secondary | ICD-10-CM

## 2018-01-28 LAB — COMPREHENSIVE METABOLIC PANEL
ALT: 17 U/L (ref 0–35)
AST: 17 U/L (ref 0–37)
Albumin: 4.2 g/dL (ref 3.5–5.2)
Alkaline Phosphatase: 36 U/L — ABNORMAL LOW (ref 39–117)
BUN: 21 mg/dL (ref 6–23)
CO2: 31 mEq/L (ref 19–32)
CREATININE: 0.71 mg/dL (ref 0.40–1.20)
Calcium: 9.2 mg/dL (ref 8.4–10.5)
Chloride: 104 mEq/L (ref 96–112)
GFR: 80.03 mL/min (ref >60.00)
Glucose, Bld: 93 mg/dL (ref 70–99)
Potassium: 4.7 mEq/L (ref 3.5–5.1)
Sodium: 140 mEq/L (ref 135–145)
TOTAL PROTEIN: 6.8 g/dL (ref 6.0–8.3)
Total Bilirubin: 0.4 mg/dL (ref 0.2–1.2)

## 2018-01-28 LAB — LIPID PANEL
CHOL/HDL RATIO: 3
Cholesterol: 202 mg/dL — ABNORMAL HIGH (ref 0–200)
HDL: 68.4 mg/dL (ref >39.00)
LDL Cholesterol: 105 mg/dL — ABNORMAL HIGH (ref 0–99)
NONHDL: 133.37
Triglycerides: 143 mg/dL (ref 0.0–149.0)
VLDL: 28.6 mg/dL (ref 0.0–40.0)

## 2018-02-04 ENCOUNTER — Encounter: Payer: Self-pay | Admitting: Family Medicine

## 2018-02-04 ENCOUNTER — Ambulatory Visit (INDEPENDENT_AMBULATORY_CARE_PROVIDER_SITE_OTHER): Payer: Medicare Other | Admitting: Family Medicine

## 2018-02-04 VITALS — BP 126/72 | HR 70 | Temp 98.2°F | Ht 60.0 in | Wt 166.4 lb

## 2018-02-04 DIAGNOSIS — M818 Other osteoporosis without current pathological fracture: Secondary | ICD-10-CM | POA: Diagnosis not present

## 2018-02-04 DIAGNOSIS — Z23 Encounter for immunization: Secondary | ICD-10-CM

## 2018-02-04 DIAGNOSIS — I10 Essential (primary) hypertension: Secondary | ICD-10-CM | POA: Diagnosis not present

## 2018-02-04 DIAGNOSIS — E785 Hyperlipidemia, unspecified: Secondary | ICD-10-CM | POA: Diagnosis not present

## 2018-02-04 MED ORDER — PANCRELIPASE (LIP-PROT-AMYL) 24000-76000 UNITS PO CPEP: ORAL_CAPSULE | ORAL | 1 refills | Status: DC

## 2018-02-04 MED ORDER — BETAMETHASONE VALERATE 0.1 % EX CREA: TOPICAL_CREAM | Freq: Two times a day (BID) | CUTANEOUS | 1 refills | Status: DC

## 2018-02-04 MED ORDER — HYDROCHLOROTHIAZIDE 12.5 MG PO TABS: 12.5000 mg | ORAL_TABLET | Freq: Every day | ORAL | 3 refills | Status: DC

## 2018-02-04 NOTE — Progress Notes (Signed)
Chief Complaint  Patient presents with  . Follow-up    6 month    Subjective Patricia Hansen is a 78 y.o. female who presents for hypertension follow up. She does not monitor home blood pressures. She is compliant with medications- HCTZ 25 mg/d, molesartan 20 mg/d Patient has these side effects of medication: none She is usually adhering to a healthy diet overall. Current exercise: walking, golfing  Hx of osteoporosis on Prolia q 6 mo. Next dose in April, 2020. She does take supp Ca and Vit D. Due for DEXA. Does exercise routinely.  Hyperlipidemia Patient presents for dyslipidemia follow up. Currently being treated with none/diet controlled. She is typically adhering to a healthy. Exercise: walking, golf The patient is not known to have coexisting coronary artery disease.    Past Medical History:  Diagnosis Date  . Arthritis   . Dyspepsia    chronic s/p EGD negative 2009  . History of Helicobacter infection    treated x 2  . Hx of colonoscopy 06/2005   no polyps found, next colonoscopy 2017  . Hypertension   . Intolerance of drug    biophosphates  . Osteopenia     Review of Systems Cardiovascular: no chest pain Respiratory:  no shortness of breath  Exam BP 126/72 (BP Location: Left Arm, Patient Position: Sitting, Cuff Size: Normal)   Pulse 70   Temp 98.2 F (36.8 C) (Oral)   Ht 5' (1.524 m)   Wt 166 lb 6 oz (75.5 kg)   SpO2 97%   BMI 32.49 kg/m  General:  well developed, well nourished, in no apparent distress Heart: RRR, no bruits, no LE edema Lungs: clear to auscultation, no accessory muscle use Psych: well oriented with normal range of affect and appropriate judgment/insight  Essential hypertension - Plan: Comprehensive metabolic panel  Other osteoporosis without current pathological fracture - Plan: DG Bone Density  Hyperlipidemia, unspecified hyperlipidemia type - Plan: Lipid panel  Need for tetanus booster - Plan: Tdap vaccine greater than or equal to  7yo IM  Orders as above. Labs are future. Counseled on diet and exercise. F/u in 6 mo. The patient voiced understanding and agreement to the plan.  [place], DO 02/04/18  2:42 PM

## 2018-02-04 NOTE — Patient Instructions (Addendum)
Someone will reach out to you regarding your bone density scan. We will be in touch regarding the results.  Keep the diet clean and stay active.  Take at least 1000 units of Vit D daily and 1200 mg of calcium daily.  Let us know if you need anything.

## 2018-02-04 NOTE — Progress Notes (Signed)
Pre visit review using our clinic review tool, if applicable. No additional management support is needed unless otherwise documented below in the visit note. 

## 2018-02-05 ENCOUNTER — Ambulatory Visit (HOSPITAL_BASED_OUTPATIENT_CLINIC_OR_DEPARTMENT_OTHER)
Admission: RE | Admit: 2018-02-05 | Discharge: 2018-02-05 | Disposition: A | Discharge status: Home / Self Care | Payer: Medicare Other | Source: Ambulatory Visit | Attending: Family Medicine | Admitting: Family Medicine

## 2018-02-05 ENCOUNTER — Encounter (INDEPENDENT_AMBULATORY_CARE_PROVIDER_SITE_OTHER): Payer: Self-pay

## 2018-02-05 DIAGNOSIS — M818 Other osteoporosis without current pathological fracture: Secondary | ICD-10-CM | POA: Diagnosis not present

## 2018-02-05 DIAGNOSIS — M85851 Other specified disorders of bone density and structure, right thigh: Secondary | ICD-10-CM | POA: Diagnosis not present

## 2018-02-08 ENCOUNTER — Other Ambulatory Visit (HOSPITAL_BASED_OUTPATIENT_CLINIC_OR_DEPARTMENT_OTHER): Payer: Self-pay | Admitting: Family Medicine

## 2018-02-08 ENCOUNTER — Encounter: Payer: Self-pay | Admitting: *Deleted

## 2018-02-08 DIAGNOSIS — Z1231 Encounter for screening mammogram for malignant neoplasm of breast: Secondary | ICD-10-CM

## 2018-02-19 ENCOUNTER — Telehealth: Payer: Self-pay | Admitting: *Deleted

## 2018-02-19 NOTE — Telephone Encounter (Signed)
Patient call and wanted to speak with [facility].  I asked to elaborate a little more to see if there is anyway I can help. She finally stated that she just needed

## 2018-02-23 ENCOUNTER — Encounter (HOSPITAL_BASED_OUTPATIENT_CLINIC_OR_DEPARTMENT_OTHER): Payer: Self-pay

## 2018-02-23 ENCOUNTER — Ambulatory Visit (HOSPITAL_BASED_OUTPATIENT_CLINIC_OR_DEPARTMENT_OTHER)
Admission: RE | Admit: 2018-02-23 | Discharge: 2018-02-23 | Disposition: A | Discharge status: Home / Self Care | Payer: Medicare Other | Source: Ambulatory Visit | Attending: Family Medicine | Admitting: Family Medicine

## 2018-02-23 DIAGNOSIS — Z1231 Encounter for screening mammogram for malignant neoplasm of breast: Secondary | ICD-10-CM | POA: Diagnosis not present

## 2018-03-04 ENCOUNTER — Encounter: Payer: Self-pay | Admitting: Family Medicine

## 2018-03-04 ENCOUNTER — Ambulatory Visit (INDEPENDENT_AMBULATORY_CARE_PROVIDER_SITE_OTHER): Payer: Medicare Other | Admitting: Family Medicine

## 2018-03-04 VITALS — BP 122/60 | HR 76 | Temp 98.4°F | Ht 61.0 in | Wt 169.0 lb

## 2018-03-04 DIAGNOSIS — H66001 Acute suppurative otitis media without spontaneous rupture of ear drum, right ear: Secondary | ICD-10-CM | POA: Diagnosis not present

## 2018-03-04 DIAGNOSIS — J069 Acute upper respiratory infection, unspecified: Secondary | ICD-10-CM | POA: Diagnosis not present

## 2018-03-04 MED ORDER — BENZONATATE 100 MG PO CAPS: 100.0000 mg | ORAL_CAPSULE | Freq: Three times a day (TID) | ORAL | 0 refills | Status: DC | PRN

## 2018-03-04 MED ORDER — AMOXICILLIN 875 MG PO TABS: 875.0000 mg | ORAL_TABLET | Freq: Two times a day (BID) | ORAL | 0 refills | Status: DC

## 2018-03-04 NOTE — Patient Instructions (Addendum)
Continue to push fluids, practice good hand hygiene, and cover your mouth if you cough.  If you start having fevers, shaking or shortness of breath, seek immediate care.  For symptoms, consider using Vick's VapoRub on chest or under nose, air humidifier, Benadryl at night, and elevating the head of the bed. Tylenol and ibuprofen for aches and pains you may be experiencing.   The antibiotic is for your ear. I think that your cough is likely viral where antibiotics are not helpful.  If no better, I want to see you next week.   Let us know if you need anything.

## 2018-03-04 NOTE — Progress Notes (Signed)
Chief Complaint  Patient presents with  . Cough    2 days    Jefm Petty here for URI complaints.  Duration: 2 days  Associated symptoms: sinus congestion, rhinorrhea, itchy throat and cough Denies: rhinorrhea, itchy watery eyes, ear pain, ear drainage, sore throat, wheezing, shortness of breath, myalgia and fevers Treatment to date: Tylenol Sick contacts: Yes- [place]  ROS:  Const: Denies fevers HEENT: As noted in HPI Lungs: +cough  Past Medical History:  Diagnosis Date  . Arthritis   . Dyspepsia    chronic s/p EGD negative 2009  . History of Helicobacter infection    treated x 2  . Hx of colonoscopy 06/2005   no polyps found, next colonoscopy 2017  . Hypertension   . Intolerance of drug    biophosphates  . Osteopenia     BP 122/60 (BP Location: Left Arm, Patient Position: Sitting, Cuff Size: Normal)   Pulse 76   Temp 98.4 F (36.9 C) (Oral)   Ht 5\' 1"  (1.549 m)   Wt 169 lb (76.7 kg)   SpO2 94%   BMI 31.93 kg/m  General: Awake, alert, appears stated age HEENT: AT, [place], ears patent and TM neg on L, suppurative, bulging and erythematous on R, nares patent w/o discharge, pharynx pink and without exudates, MMM Neck: No masses or asymmetry Heart: RRR Lungs: CTAB, no accessory muscle use Psych: Age appropriate judgment and insight, normal mood and affect  Acute suppurative otitis media of right ear without spontaneous rupture of tympanic membrane, recurrence not specified - Plan: amoxicillin (AMOXIL) 875 MG tablet  URI with cough and congestion - Plan: benzonatate (TESSALON) 100 MG capsule  Orders as above. Her ear looks quite purulent. Abx for that, supportive care for cough.  Continue to push fluids, practice good hand hygiene, cover mouth when coughing. F/u early next week prn. If starting to experience fevers, shaking, or shortness of breath, seek immediate care. Pt voiced understanding and agreement to the plan.  [place], DO 03/04/18 11:33  AM

## 2018-03-09 ENCOUNTER — Telehealth: Payer: Self-pay | Admitting: Family Medicine

## 2018-03-09 NOTE — Telephone Encounter (Signed)
Pt came in office stating needing refill on Benzonatate (Tessalon) 100 mg - pt stated that is wanting to get 200 mg instead of 100 mg since pt was taking double. (pt stated 100 mg was not working for her). Please send to [facility] that is on pt's file. Please advise.

## 2018-03-10 MED ORDER — BENZONATATE 200 MG PO CAPS: 200.0000 mg | ORAL_CAPSULE | Freq: Three times a day (TID) | ORAL | 0 refills | Status: DC | PRN

## 2018-03-10 NOTE — Telephone Encounter (Signed)
Called left detailed message prescription sent in.

## 2018-03-10 NOTE — Telephone Encounter (Signed)
Done

## 2018-03-22 ENCOUNTER — Telehealth: Payer: Self-pay | Admitting: Family Medicine

## 2018-03-22 ENCOUNTER — Other Ambulatory Visit: Payer: Self-pay | Admitting: Family Medicine

## 2018-03-22 NOTE — Telephone Encounter (Signed)
Patient came into the office requesting refill of benzonatate 200 mg. She is completely out. Please advise.

## 2018-03-22 NOTE — Telephone Encounter (Signed)
Called the patient left detailed message prescription sent in already. Also informed to schedule appt if when completing this script if no better.

## 2018-04-07 ENCOUNTER — Other Ambulatory Visit: Payer: Self-pay | Admitting: Family Medicine

## 2018-04-19 ENCOUNTER — Ambulatory Visit (INDEPENDENT_AMBULATORY_CARE_PROVIDER_SITE_OTHER): Payer: Medicare Other | Admitting: Family Medicine

## 2018-04-19 ENCOUNTER — Encounter: Payer: Self-pay | Admitting: Family Medicine

## 2018-04-19 ENCOUNTER — Other Ambulatory Visit: Payer: Self-pay

## 2018-04-19 VITALS — BP 108/72 | HR 85 | Temp 98.1°F | Ht 60.0 in | Wt 167.0 lb

## 2018-04-19 DIAGNOSIS — M810 Age-related osteoporosis without current pathological fracture: Secondary | ICD-10-CM

## 2018-04-19 DIAGNOSIS — M818 Other osteoporosis without current pathological fracture: Secondary | ICD-10-CM

## 2018-04-19 DIAGNOSIS — I10 Essential (primary) hypertension: Secondary | ICD-10-CM | POA: Diagnosis not present

## 2018-04-19 MED ORDER — DENOSUMAB 60 MG/ML ~~LOC~~ SOSY
60.0000 mg | PREFILLED_SYRINGE | Freq: Once | SUBCUTANEOUS | Status: AC
  Administered 2018-04-19: 15:00:00 60 mg via SUBCUTANEOUS

## 2018-04-19 NOTE — Patient Instructions (Signed)
Keep the diet clean and stay active.  Aim to do some physical exertion for 150 minutes per week. This is typically divided into 5 days per week, 30 minutes per day. The activity should be enough to get your heart rate up. Anything is better than nothing if you have time constraints.  Let us know if you need anything. 

## 2018-04-19 NOTE — Progress Notes (Signed)
Chief Complaint  Patient presents with  . Follow-up    Subjective Patricia Hansen is a 78 y.o. female who presents for hypertension follow up. She does not monitor home blood pressures. She is compliant with medications- HCTZ 25 mg/d, Benicar 20 mg/d. Patient has these side effects of medication: none She is adhering to a healthy diet overall. Current exercise: golf  Osteoporosis Takes 5000 u Vit D, also takes Ca. Stays active. Has been on Prolia and doing well.    Past Medical History:  Diagnosis Date  . Arthritis   . Dyspepsia    chronic s/p EGD negative 2009  . History of Helicobacter infection    treated x 2  . Hx of colonoscopy 06/2005   no polyps found, next colonoscopy 2017  . Hypertension   . Intolerance of drug    biophosphates  . Osteopenia     Review of Systems Cardiovascular: no chest pain Respiratory:  no shortness of breath  Exam BP 108/72 (BP Location: Left Arm, Patient Position: Sitting, Cuff Size: Normal)   Pulse 85   Temp 98.1 F (36.7 C) (Oral)   Ht 5' (1.524 m)   Wt 167 lb (75.8 kg)   SpO2 95%   BMI 32.61 kg/m  General:  well developed, well nourished, in no apparent distress Heart: RRR, no bruits, trace pitting b/l LE edema Lungs: clear to auscultation, no accessory muscle use Psych: well oriented with normal range of affect and appropriate judgment/insight  Essential hypertension  Other osteoporosis without current pathological fracture - Plan: denosumab (PROLIA) injection 60 mg  Orders as above. Counseled on diet and exercise. F/u as originally scheudled. The patient voiced understanding and agreement to the plan.  [place], DO 04/19/18  3:07 PM

## 2018-07-26 ENCOUNTER — Other Ambulatory Visit (INDEPENDENT_AMBULATORY_CARE_PROVIDER_SITE_OTHER): Payer: Medicare Other

## 2018-07-26 ENCOUNTER — Other Ambulatory Visit: Payer: Self-pay

## 2018-07-26 DIAGNOSIS — E785 Hyperlipidemia, unspecified: Secondary | ICD-10-CM | POA: Diagnosis not present

## 2018-07-26 DIAGNOSIS — I10 Essential (primary) hypertension: Secondary | ICD-10-CM

## 2018-07-26 LAB — COMPREHENSIVE METABOLIC PANEL
ALT: 21 U/L (ref 0–35)
AST: 19 U/L (ref 0–37)
Albumin: 4.3 g/dL (ref 3.5–5.2)
Alkaline Phosphatase: 34 U/L — ABNORMAL LOW (ref 39–117)
BUN: 14 mg/dL (ref 6–23)
CO2: 28 mEq/L (ref 19–32)
Calcium: 8.5 mg/dL (ref 8.4–10.5)
Chloride: 107 mEq/L (ref 96–112)
Creatinine, Ser: 0.67 mg/dL (ref 0.40–1.20)
GFR: 85.46 mL/min (ref >60.00)
Glucose, Bld: 95 mg/dL (ref 70–99)
Potassium: 3.9 mEq/L (ref 3.5–5.1)
Sodium: 142 mEq/L (ref 135–145)
Total Bilirubin: 0.6 mg/dL (ref 0.2–1.2)
Total Protein: 6.5 g/dL (ref 6.0–8.3)

## 2018-07-26 LAB — LIPID PANEL
Cholesterol: 174 mg/dL (ref 0–200)
HDL: 63.7 mg/dL (ref >39.00)
LDL Cholesterol: 92 mg/dL (ref 0–99)
NonHDL: 109.93
Total CHOL/HDL Ratio: 3
Triglycerides: 92 mg/dL (ref 0.0–149.0)
VLDL: 18.4 mg/dL (ref 0.0–40.0)

## 2018-08-02 ENCOUNTER — Encounter: Payer: Self-pay | Admitting: Family Medicine

## 2018-08-02 ENCOUNTER — Other Ambulatory Visit: Payer: Self-pay

## 2018-08-02 ENCOUNTER — Ambulatory Visit (INDEPENDENT_AMBULATORY_CARE_PROVIDER_SITE_OTHER): Payer: Medicare Other | Admitting: Family Medicine

## 2018-08-02 VITALS — BP 110/62 | HR 63 | Temp 98.4°F | Ht 60.0 in | Wt 164.0 lb

## 2018-08-02 DIAGNOSIS — E785 Hyperlipidemia, unspecified: Secondary | ICD-10-CM

## 2018-08-02 DIAGNOSIS — M818 Other osteoporosis without current pathological fracture: Secondary | ICD-10-CM

## 2018-08-02 DIAGNOSIS — I10 Essential (primary) hypertension: Secondary | ICD-10-CM | POA: Diagnosis not present

## 2018-08-02 MED ORDER — OLMESARTAN MEDOXOMIL 20 MG PO TABS: 20.0000 mg | ORAL_TABLET | Freq: Every day | ORAL | 3 refills | Status: DC

## 2018-08-02 MED ORDER — HYDROCHLOROTHIAZIDE 12.5 MG PO TABS: 12.5000 mg | ORAL_TABLET | Freq: Every day | ORAL | 3 refills | Status: DC

## 2018-08-02 NOTE — Progress Notes (Signed)
Chief Complaint  Patient presents with  . Follow-up    Subjective Patricia Hansen is a 78 y.o. female who presents for hypertension follow up. She does not monitor home blood pressures. She is compliant with medications- HCTZ 12.5 mg/d, Benicar 20 mg/d.  Patient has these side effects of medication: none She is sometimes adhering to a healthy diet overall. Current exercise: golfing  Hx of osteoporosis. Taking Prolia in addn to Vit d supp. Reports some exercise.   Hx of pancreatic insuff. Takes Creon, works well for her.    Past Medical History:  Diagnosis Date  . Arthritis   . Dyspepsia    chronic s/p EGD negative 2009  . History of Helicobacter infection    treated x 2  . Hx of colonoscopy 06/2005   no polyps found, next colonoscopy 2017  . Hypertension   . Intolerance of drug    biophosphates  . Osteopenia     Review of Systems Cardiovascular: no chest pain Respiratory:  no shortness of breath  Exam BP 110/62 (BP Location: Left Arm, Patient Position: Sitting, Cuff Size: Normal)   Pulse 63   Temp 98.4 F (36.9 C) (Oral)   Ht 5' (1.524 m)   Wt 164 lb (74.4 kg)   SpO2 93%   BMI 32.03 kg/m  General:  well developed, well nourished, in no apparent distress Heart: RRR, no bruits, no LE edema Lungs: clear to auscultation, no accessory muscle use Psych: well oriented with normal range of affect and appropriate judgment/insight  Essential hypertension - Plan: Comprehensive metabolic panel, cont meds  Hyperlipidemia, unspecified hyperlipidemia type - Plan: Comprehensive metabolic panel, Lipid panel, cont dietary changes  Other osteoporosis without current pathological fracture - Plan: Vitamin D (25 hydroxy), cont Prolia.  Abd pain- Cont Creon.   Orders as above. Counseled on diet and exercise. F/u in 6 mo. The patient voiced understanding and agreement to the plan.  [place], DO 08/02/18  4:47 PM

## 2018-08-02 NOTE — Patient Instructions (Addendum)
Keep the diet clean and stay active.  Continue taking Vit D.  Let me know when you need refills.   Let us know if you need anything.

## 2018-08-20 DIAGNOSIS — H838X3 Other specified diseases of inner ear, bilateral: Secondary | ICD-10-CM | POA: Diagnosis not present

## 2018-08-20 DIAGNOSIS — H6521 Chronic serous otitis media, right ear: Secondary | ICD-10-CM | POA: Diagnosis not present

## 2018-08-20 DIAGNOSIS — H9071 Mixed conductive and sensorineural hearing loss, unilateral, right ear, with unrestricted hearing on the contralateral side: Secondary | ICD-10-CM | POA: Diagnosis not present

## 2018-09-03 DIAGNOSIS — H6521 Chronic serous otitis media, right ear: Secondary | ICD-10-CM | POA: Diagnosis not present

## 2018-09-03 DIAGNOSIS — H6981 Other specified disorders of Eustachian tube, right ear: Secondary | ICD-10-CM | POA: Diagnosis not present

## 2018-09-03 DIAGNOSIS — H9011 Conductive hearing loss, unilateral, right ear, with unrestricted hearing on the contralateral side: Secondary | ICD-10-CM | POA: Diagnosis not present

## 2018-10-11 ENCOUNTER — Ambulatory Visit (INDEPENDENT_AMBULATORY_CARE_PROVIDER_SITE_OTHER): Payer: Medicare Other | Admitting: Family Medicine

## 2018-10-11 ENCOUNTER — Other Ambulatory Visit: Payer: Self-pay

## 2018-10-11 ENCOUNTER — Encounter: Payer: Self-pay | Admitting: Family Medicine

## 2018-10-11 VITALS — BP 124/76 | HR 58 | Temp 97.5°F | Ht 60.0 in | Wt 166.0 lb

## 2018-10-11 DIAGNOSIS — H6641 Suppurative otitis media, unspecified, right ear: Secondary | ICD-10-CM | POA: Diagnosis not present

## 2018-10-11 DIAGNOSIS — Z23 Encounter for immunization: Secondary | ICD-10-CM

## 2018-10-11 DIAGNOSIS — T7840XA Allergy, unspecified, initial encounter: Secondary | ICD-10-CM | POA: Diagnosis not present

## 2018-10-11 MED ORDER — AMOXICILLIN-POT CLAVULANATE 875-125 MG PO TABS: 1.0000 | ORAL_TABLET | Freq: Two times a day (BID) | ORAL | 0 refills | Status: DC

## 2018-10-11 MED ORDER — LEVOCETIRIZINE DIHYDROCHLORIDE 5 MG PO TABS: 5.0000 mg | ORAL_TABLET | Freq: Every evening | ORAL | 5 refills | Status: DC

## 2018-10-11 NOTE — Progress Notes (Signed)
Chief Complaint  Patient presents with  . Follow-up    check ear  and mango allergy    Pt is here for right ear pain. Duration: several weeks Progression: unchanged Associated symptoms: none Denies: sore throat, congestion, post nasal drip, coryza, sneezing and ear pressure, bleeding, or discharge from ear Treatment to date: Had a partial course of Augmentin that she did not fill because she thought she was having GI side effects which turned out to be an mango allergy.  Over the past several weeks, she has been having a dry cough.  It is worse with temperature changes.  No other upper respiratory symptoms.  No shortness of breath, wheezing, history of asthma, smoking, or swelling.  It is not associated with meals.  ROS:  HEENT: +ear pain Costitutional: Denies fevers  Past Medical History:  Diagnosis Date  . Arthritis   . Dyspepsia    chronic s/p EGD negative 2009  . History of Helicobacter infection    treated x 2  . Hx of colonoscopy 06/2005   no polyps found, next colonoscopy 2017  . Hypertension   . Intolerance of drug    biophosphates  . Osteopenia    Family History  Problem Relation Age of Onset  . Uterine cancer Mother        deceased  . Hypertension Father        deceased  . Other Other        no history of breast cancer, diabetes or colon cancer  . Colon polyps Neg Hx   . Colon cancer Neg Hx   . Esophageal cancer Neg Hx   . Stomach cancer Neg Hx   . Rectal cancer Neg Hx    Past Surgical History:  Procedure Laterality Date  . BREAST LUMPECTOMY Right 1980   Benign per patient  . COLONOSCOPY    . TUBAL LIGATION    . UPPER GASTROINTESTINAL ENDOSCOPY      BP 124/76 (BP Location: Left Arm, Patient Position: Sitting, Cuff Size: Normal)   Pulse (!) 58   Temp (!) 97.5 F (36.4 C) (Temporal)   Ht 5' (1.524 m)   Wt 166 lb (75.3 kg)   SpO2 95%   BMI 32.42 kg/m  General: Awake, alert, appearing stated age HEENT:  L ear- Canal patent without drainage or  erythema, TM is neg R ear- canal patent without drainage or erythema, TM has lost landmarks and has purulent material behind it Nose- nares patent and without discharge Mouth- Lips, gums and dentition unremarkable, pharynx is without erythema or exudate Neck: No adenopathy Lungs: CTAB. Normal effort, no accessory muscle use Heart: RRR, no bruits, no LE edema Psych: Age appropriate judgment and insight, normal mood and affect  Suppurative otitis media of right ear, unspecified chronicity - Plan: amoxicillin-clavulanate (AUGMENTIN) 875-125 MG tablet  Allergy, initial encounter - Plan: levocetirizine (XYZAL) 5 MG tablet  Need for influenza vaccination - Plan: Flu Vaccine QUAD High Dose(Fluad)  If the antibiotic does not help, she needs a follow-up with her regular ENT for possible tube placement. Start oral antihistamine, suspect allergies, possibly allergy induced asthma. F/u in 4 weeks to recheck her cough. Pt voiced understanding and agreement to the plan.  [place], DO 10/11/18 4:52 PM

## 2018-10-11 NOTE — Patient Instructions (Addendum)
If things don't get better with your ear, you need to call your ENT provider.   OK to use Debrox (peroxide) in the ear to loosen up wax. Also recommend using a bulb syringe (for removing boogers from baby's noses) to flush through warm water. Do not use Q-tips as this can impact wax further.  If your cough does not get better in the next few weeks, I want to see you.  Let us know if you need anything.

## 2018-10-19 ENCOUNTER — Other Ambulatory Visit: Payer: Self-pay

## 2018-10-19 ENCOUNTER — Ambulatory Visit (INDEPENDENT_AMBULATORY_CARE_PROVIDER_SITE_OTHER): Payer: Medicare Other

## 2018-10-19 DIAGNOSIS — M818 Other osteoporosis without current pathological fracture: Secondary | ICD-10-CM

## 2018-10-19 MED ORDER — DENOSUMAB 60 MG/ML ~~LOC~~ SOSY
60.0000 mg | PREFILLED_SYRINGE | Freq: Once | SUBCUTANEOUS | Status: AC
  Administered 2018-10-19: 60 mg via SUBCUTANEOUS

## 2018-10-25 NOTE — Progress Notes (Signed)
Reviewed.  [place] Shivansh Hardaway 7:15 AM 10/25/18

## 2018-11-25 ENCOUNTER — Telehealth: Payer: Self-pay | Admitting: Family Medicine

## 2018-11-25 NOTE — Telephone Encounter (Signed)
Called patient to schedule AWV, but now answer. Will try to call patient back at a later time. SF

## 2019-04-11 ENCOUNTER — Ambulatory Visit: Payer: Medicare Other | Admitting: Family Medicine

## 2019-04-18 ENCOUNTER — Ambulatory Visit: Payer: Medicare Other | Admitting: Family Medicine

## 2019-04-19 ENCOUNTER — Ambulatory Visit: Payer: Medicare Other

## 2019-04-29 ENCOUNTER — Other Ambulatory Visit: Payer: Self-pay | Admitting: Family Medicine

## 2019-05-06 ENCOUNTER — Encounter: Payer: Self-pay | Admitting: Family Medicine

## 2019-05-06 ENCOUNTER — Ambulatory Visit (INDEPENDENT_AMBULATORY_CARE_PROVIDER_SITE_OTHER): Payer: Medicare Other | Admitting: Family Medicine

## 2019-05-06 ENCOUNTER — Other Ambulatory Visit: Payer: Self-pay

## 2019-05-06 ENCOUNTER — Telehealth: Payer: Self-pay | Admitting: Family Medicine

## 2019-05-06 VITALS — BP 110/68 | HR 66 | Temp 96.4°F | Ht 60.0 in | Wt 161.0 lb

## 2019-05-06 DIAGNOSIS — M818 Other osteoporosis without current pathological fracture: Secondary | ICD-10-CM | POA: Diagnosis not present

## 2019-05-06 DIAGNOSIS — I10 Essential (primary) hypertension: Secondary | ICD-10-CM

## 2019-05-06 NOTE — Telephone Encounter (Signed)
Patients last Prolia was 10/19/2018 and is now due . Last Bone Density 02/05/2018.  Please let the patient know when authorization is complete/and know her cost. thanks

## 2019-05-06 NOTE — Patient Instructions (Signed)
Keep the diet clean and stay active.  Sorry about the Prolia issue. We will look into it.  Let us know if you need anything.

## 2019-05-06 NOTE — Progress Notes (Signed)
Chief Complaint  Patient presents with  . Follow-up    Subjective Patricia Hansen is a 79 y.o. female who presents for hypertension follow up. She does not monitor home blood pressures. She is compliant with medications- HCTZ 12.5 mg/d, olmesartan 40 mg/d. Patient has these side effects of medication: none She is usually adhering to a healthy diet overall. Current exercise: golfing  Osteoporosis Takes twice yearly Prolia injection. Takes Ca and Vit D. No bone pain or fractures since last visit.    Past Medical History:  Diagnosis Date  . Arthritis   . Dyspepsia    chronic s/p EGD negative 2009  . History of Helicobacter infection    treated x 2  . Hx of colonoscopy 06/2005   no polyps found, next colonoscopy 2017  . Hypertension   . Intolerance of drug    biophosphates  . Osteopenia    Exam BP 110/68 (BP Location: Left Arm, Patient Position: Sitting, Cuff Size: Normal)   Pulse 66   Temp (!) 96.4 F (35.8 C) (Temporal)   Ht 5' (1.524 m)   Wt 161 lb (73 kg)   SpO2 95%   BMI 31.44 kg/m  General:  well developed, well nourished, in no apparent distress Heart: RRR, no bruits, no LE edema Lungs: clear to auscultation, no accessory muscle use Psych: well oriented with normal range of affect and appropriate judgment/insight  Essential hypertension  Other osteoporosis without current pathological fracture  1- Cont meds. Counseled on diet and exercise. Does not need to monitor BP at home. 2- Cont Prolia. Was not scheduled for today unfortunately. Cont wt bearing activity. Cont Ca and Vit D.  F/u in 6 mo. The patient voiced understanding and agreement to the plan.  [place], DO 05/06/19  3:27 PM

## 2019-05-09 NOTE — Telephone Encounter (Signed)
Re-submitted insurance information. Waiting on Summary of Benefits.

## 2019-05-18 ENCOUNTER — Other Ambulatory Visit: Payer: Self-pay

## 2019-05-18 ENCOUNTER — Ambulatory Visit (INDEPENDENT_AMBULATORY_CARE_PROVIDER_SITE_OTHER): Payer: Medicare Other

## 2019-05-18 DIAGNOSIS — M818 Other osteoporosis without current pathological fracture: Secondary | ICD-10-CM | POA: Diagnosis not present

## 2019-05-18 MED ORDER — DENOSUMAB 60 MG/ML ~~LOC~~ SOSY
60.0000 mg | PREFILLED_SYRINGE | Freq: Once | SUBCUTANEOUS | Status: AC
  Administered 2019-05-18: 15:00:00 60 mg via SUBCUTANEOUS

## 2019-05-18 NOTE — Telephone Encounter (Signed)
Patient benefits back cost to patient. Ok to have injection today.

## 2019-05-18 NOTE — Progress Notes (Signed)
Patient given her Prolia today in left arm. The patient tolerated injection well. Patient given reminder date for next Prolia in 6 months (11/18/2019). The patient verbalized understanding and tolerated injection well.

## 2019-09-11 ENCOUNTER — Encounter: Payer: Self-pay | Admitting: *Deleted

## 2019-10-06 DIAGNOSIS — Z23 Encounter for immunization: Secondary | ICD-10-CM | POA: Diagnosis not present

## 2019-10-28 ENCOUNTER — Telehealth: Payer: Self-pay | Admitting: *Deleted

## 2019-10-28 DIAGNOSIS — E785 Hyperlipidemia, unspecified: Secondary | ICD-10-CM | POA: Insufficient documentation

## 2019-10-28 DIAGNOSIS — R7303 Prediabetes: Secondary | ICD-10-CM

## 2019-10-28 DIAGNOSIS — I10 Essential (primary) hypertension: Secondary | ICD-10-CM

## 2019-10-28 DIAGNOSIS — Z1159 Encounter for screening for other viral diseases: Secondary | ICD-10-CM

## 2019-10-28 NOTE — Telephone Encounter (Signed)
Pt has lab appt Monday morning but I do not see any orders in Epic.  Please place orders if appropriate.

## 2019-10-31 ENCOUNTER — Other Ambulatory Visit: Payer: Self-pay

## 2019-10-31 ENCOUNTER — Other Ambulatory Visit (INDEPENDENT_AMBULATORY_CARE_PROVIDER_SITE_OTHER): Payer: Medicare Other

## 2019-10-31 DIAGNOSIS — R7303 Prediabetes: Secondary | ICD-10-CM | POA: Diagnosis not present

## 2019-10-31 DIAGNOSIS — Z1159 Encounter for screening for other viral diseases: Secondary | ICD-10-CM

## 2019-10-31 DIAGNOSIS — E785 Hyperlipidemia, unspecified: Secondary | ICD-10-CM | POA: Diagnosis not present

## 2019-11-01 ENCOUNTER — Other Ambulatory Visit: Payer: Medicare Other

## 2019-11-01 LAB — HEPATITIS C ANTIBODY
Hepatitis C Ab: NONREACTIVE
SIGNAL TO CUT-OFF: 0.01 (ref <1.00)

## 2019-11-01 LAB — COMPREHENSIVE METABOLIC PANEL
AG Ratio: 1.6 (calc) (ref 1.0–2.5)
ALT: 17 U/L (ref 6–29)
AST: 18 U/L (ref 10–35)
Albumin: 4.4 g/dL (ref 3.6–5.1)
Alkaline phosphatase (APISO): 36 U/L — ABNORMAL LOW (ref 37–153)
BUN: 21 mg/dL (ref 7–25)
CO2: 25 mmol/L (ref 20–32)
Calcium: 9.2 mg/dL (ref 8.6–10.4)
Chloride: 102 mmol/L (ref 98–110)
Creat: 0.71 mg/dL (ref 0.60–0.93)
Globulin: 2.7 g/dL (calc) (ref 1.9–3.7)
Glucose, Bld: 97 mg/dL (ref 65–99)
Potassium: 4.4 mmol/L (ref 3.5–5.3)
Sodium: 138 mmol/L (ref 135–146)
Total Bilirubin: 0.4 mg/dL (ref 0.2–1.2)
Total Protein: 7.1 g/dL (ref 6.1–8.1)

## 2019-11-01 LAB — LIPID PANEL
Cholesterol: 193 mg/dL (ref <200)
HDL: 75 mg/dL (ref >=50)
LDL Cholesterol (Calc): 101 mg/dL (calc) — ABNORMAL HIGH
Non-HDL Cholesterol (Calc): 118 mg/dL (calc) (ref <130)
Total CHOL/HDL Ratio: 2.6 (calc) (ref <5.0)
Triglycerides: 84 mg/dL (ref <150)

## 2019-11-01 LAB — HEMOGLOBIN A1C
Hgb A1c MFr Bld: 5.9 % of total Hgb — ABNORMAL HIGH (ref <5.7)
Mean Plasma Glucose: 123 (calc)
eAG (mmol/L): 6.8 (calc)

## 2019-11-07 ENCOUNTER — Other Ambulatory Visit: Payer: Self-pay

## 2019-11-07 ENCOUNTER — Ambulatory Visit (INDEPENDENT_AMBULATORY_CARE_PROVIDER_SITE_OTHER): Payer: Medicare Other | Admitting: Family Medicine

## 2019-11-07 ENCOUNTER — Encounter: Payer: Self-pay | Admitting: Family Medicine

## 2019-11-07 VITALS — BP 112/64 | HR 69 | Temp 98.2°F | Ht 61.0 in | Wt 162.4 lb

## 2019-11-07 DIAGNOSIS — R7303 Prediabetes: Secondary | ICD-10-CM

## 2019-11-07 DIAGNOSIS — I10 Essential (primary) hypertension: Secondary | ICD-10-CM

## 2019-11-07 DIAGNOSIS — E785 Hyperlipidemia, unspecified: Secondary | ICD-10-CM | POA: Diagnosis not present

## 2019-11-07 NOTE — Patient Instructions (Signed)
Keep the diet clean and stay active.  Your labs look normal also.  Let us know if you need anything.

## 2019-11-07 NOTE — Progress Notes (Signed)
Chief Complaint  Patient presents with  . Follow-up    Subjective Patricia Hansen is a 79 y.o. female who presents for hypertension follow up. She does not monitor home blood pressures. She is compliant with medications. Patient has these side effects of medication: none She is adhering to a healthy diet overall. Current exercise: golfing  Hyperlipidemia Patient presents for dyslipidemia follow up. Currently being treated with "diet controlled" and compliance with treatment thus far has been good. She denies myalgias. Diet and exercise as above.  The patient is not known to have coexisting coronary artery disease.   Past Medical History:  Diagnosis Date  . Arthritis   . Dyspepsia    chronic s/p EGD negative 2009  . History of Helicobacter infection    treated x 2  . Hx of colonoscopy 06/2005   no polyps found, next colonoscopy 2017  . Hypertension   . Intolerance of drug    biophosphates  . Osteopenia     Exam BP 112/64 (BP Location: Left Arm, Patient Position: Sitting, Cuff Size: Normal)   Pulse 69   Temp 98.2 F (36.8 C) (Oral)   Ht 5\' 1"  (1.549 m)   Wt 162 lb 6 oz (73.7 kg)   SpO2 94%   BMI 30.68 kg/m  General:  well developed, well nourished, in no apparent distress Heart: RRR, no bruits, no LE edema Lungs: clear to auscultation, no accessory muscle use Psych: well oriented with normal range of affect and appropriate judgment/insight  Essential hypertension  Hyperlipidemia, unspecified hyperlipidemia type - Plan: Lipid panel, Comprehensive metabolic panel  Prediabetes - Plan: Hemoglobin A1c  1. Cont HCTZ 12.5 mg/d and Benicar 20 mg/d. Counseled on diet and exercise. Does not need to monitor at home. 2. Cont diet control. Monitor las.  F/u in 6 mo, labs 1 week prior. The patient voiced understanding and agreement to the plan.  [place], DO 11/07/19  2:38 PM

## 2019-11-22 ENCOUNTER — Ambulatory Visit (INDEPENDENT_AMBULATORY_CARE_PROVIDER_SITE_OTHER): Payer: Medicare Other

## 2019-11-22 ENCOUNTER — Other Ambulatory Visit: Payer: Self-pay

## 2019-11-22 DIAGNOSIS — M810 Age-related osteoporosis without current pathological fracture: Secondary | ICD-10-CM

## 2019-11-22 MED ORDER — DENOSUMAB 60 MG/ML ~~LOC~~ SOSY
60.0000 mg | PREFILLED_SYRINGE | Freq: Once | SUBCUTANEOUS | Status: AC
  Administered 2019-11-22: 60 mg via SUBCUTANEOUS

## 2019-11-22 NOTE — Progress Notes (Signed)
Patient here today for prolia injection per Dr. Nani Ravens. 77mL given in left arm SQ. Patient tolerated well. Next dose due 05/23/2019 approx.

## 2020-02-15 IMAGING — MG DIGITAL SCREENING BILATERAL MAMMOGRAM WITH TOMO AND CAD
6 of 10 series · 6 of 30 positions shown · non-contrast
Comparison: Previous exam(s).

CLINICAL DATA: Screening.

EXAM:
DIGITAL SCREENING BILATERAL MAMMOGRAM WITH TOMO AND CAD

[L CC synth-2D]
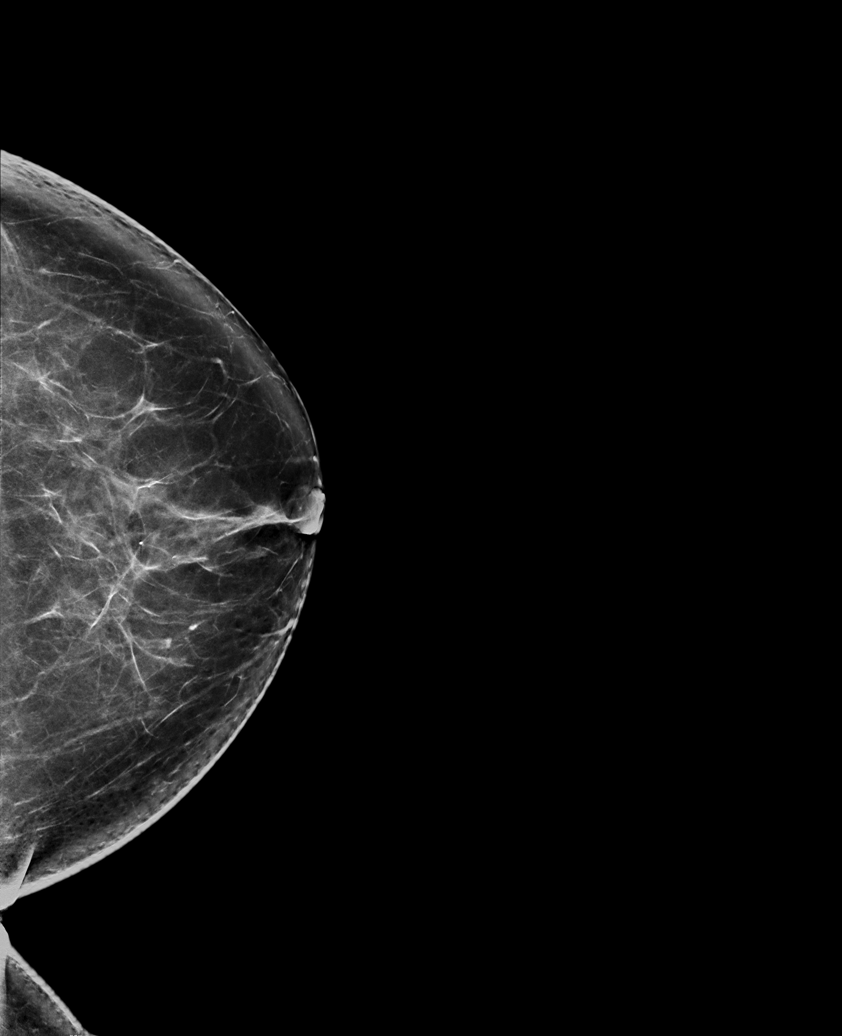

[R CC synth-2D]
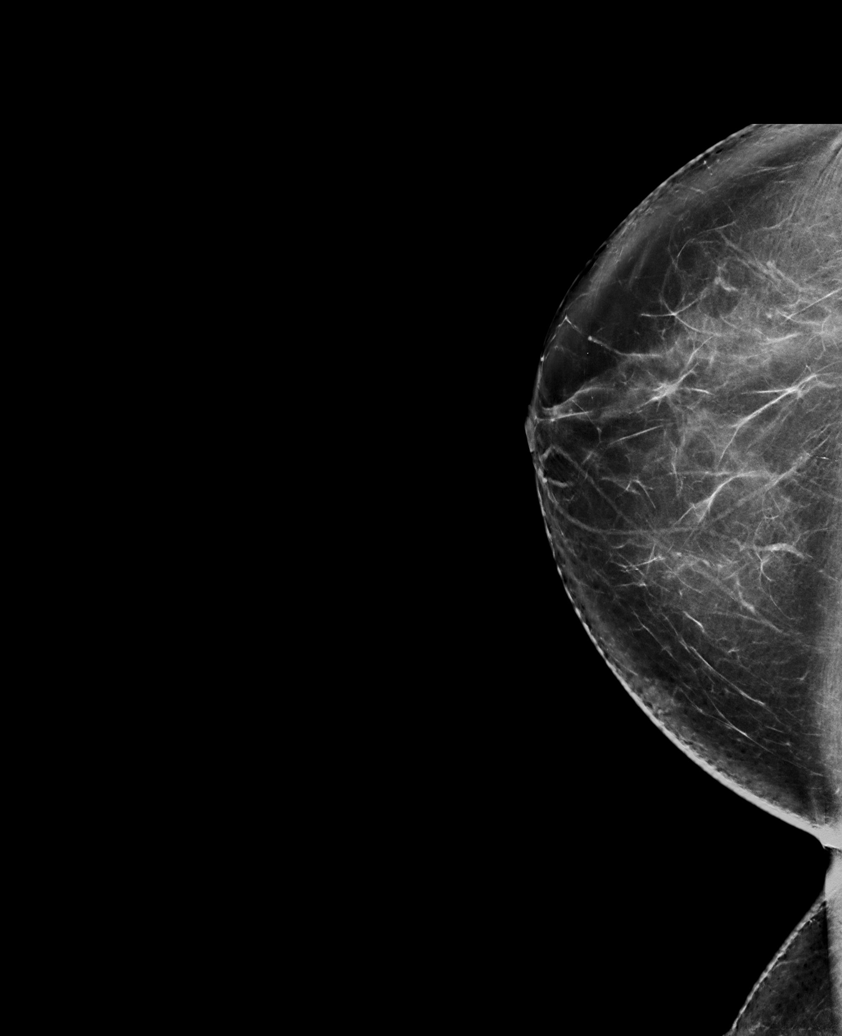

[L MLO synth-2D]
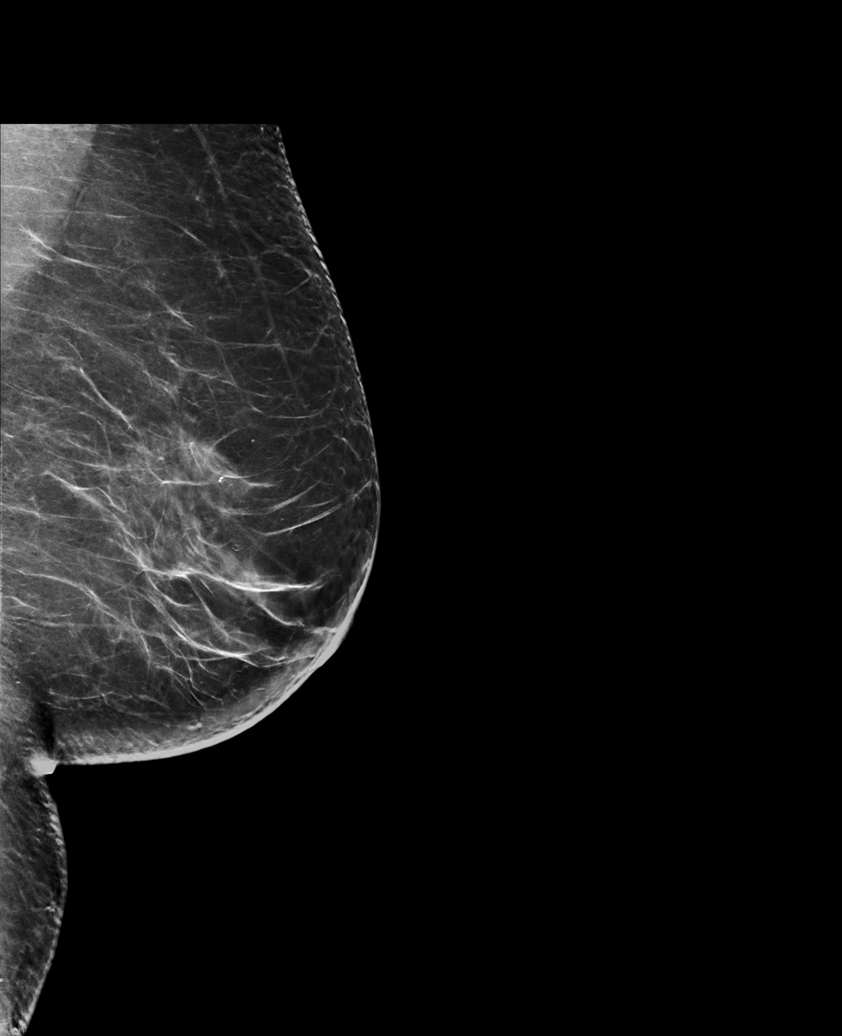

[R MLO synth-2D]
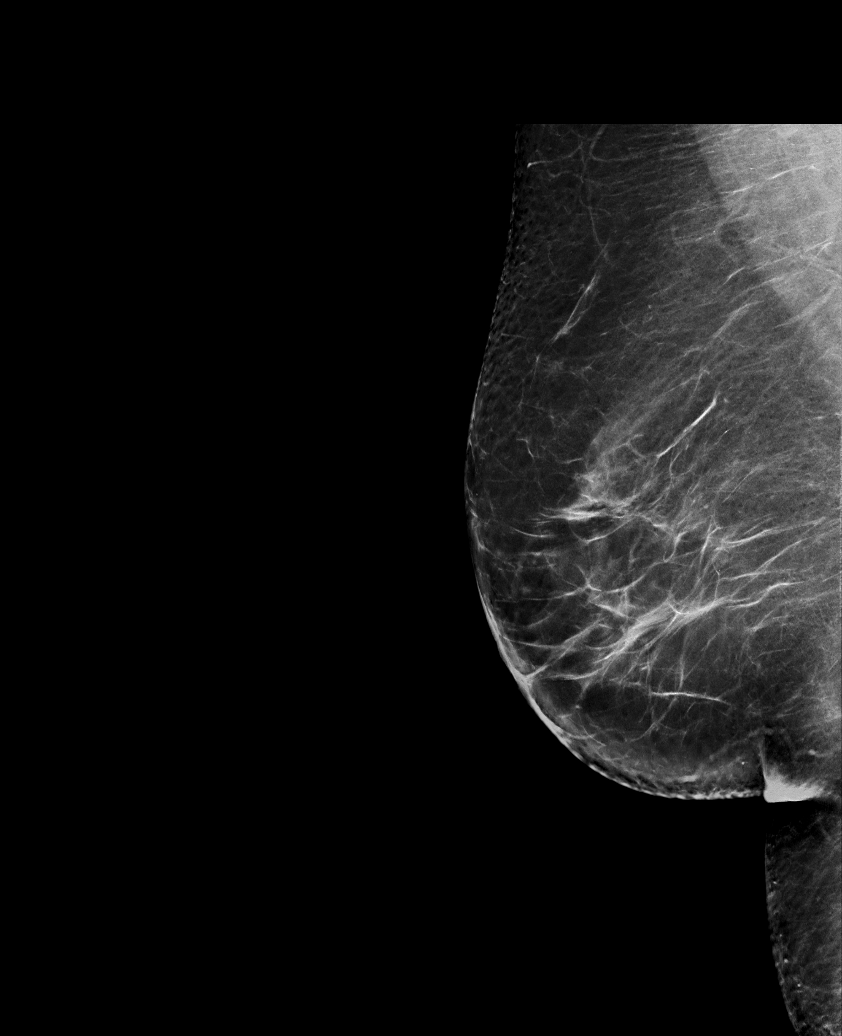

[R XCCL synth-2D]
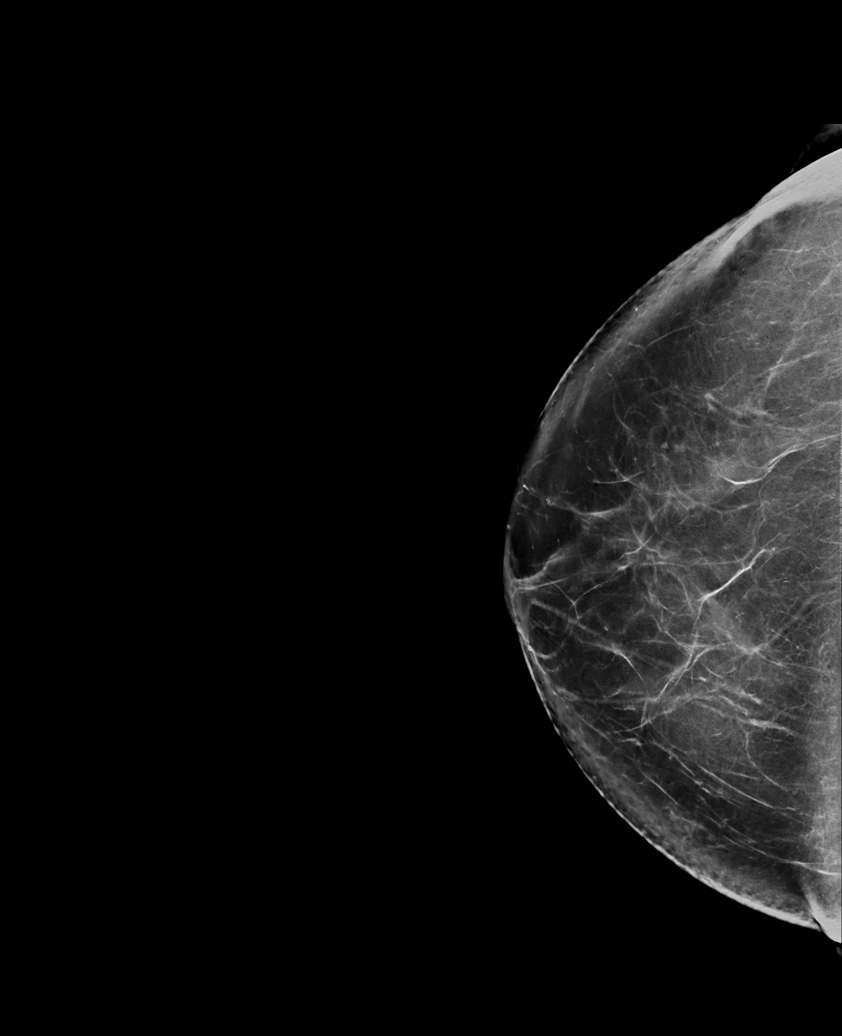

[L CC tomo · tomo slice 43/85.0]
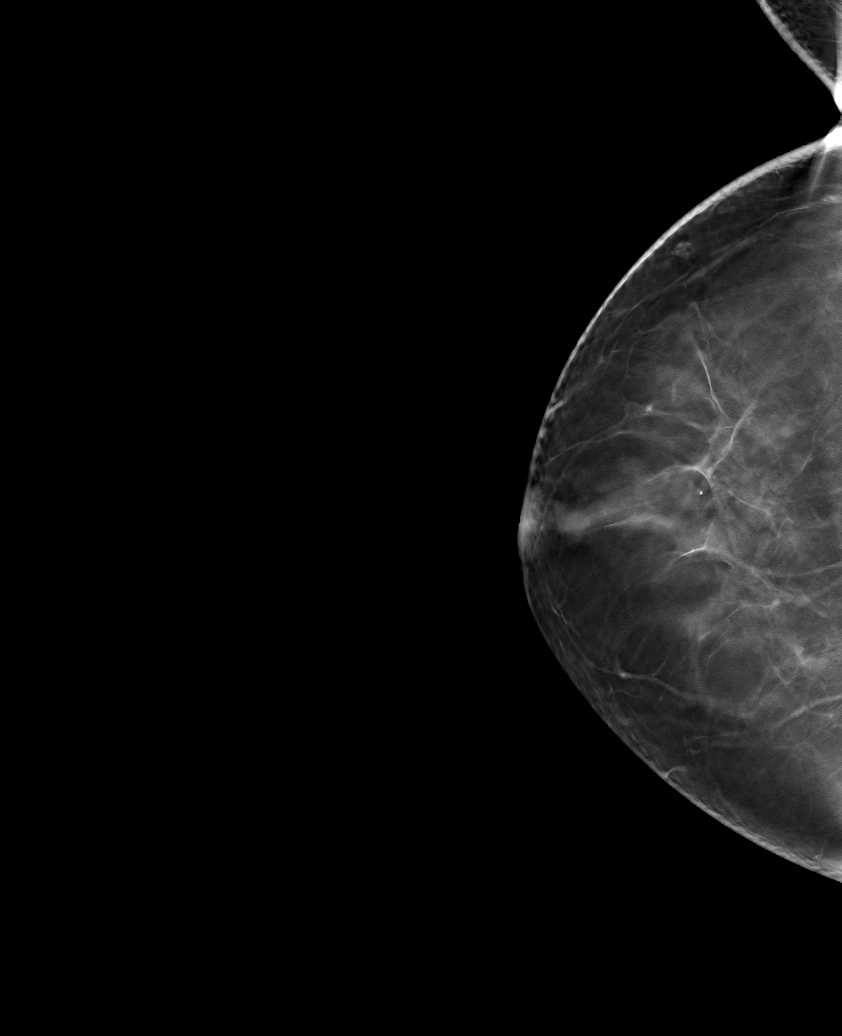

[6 of 30 positions shown; findings below may reference images not displayed]

ACR Breast Density Category c: The breast tissue is heterogeneously
dense, which may obscure small masses.
FINDINGS: There are no findings suspicious for malignancy. Images were
processed with CAD.
IMPRESSION: No mammographic evidence of malignancy. A result letter of this
screening mammogram will be mailed directly to the patient.

RECOMMENDATION:
Screening mammogram in one year. (Code:[id number])

BI-RADS CATEGORY  1: Negative.

## 2020-04-27 ENCOUNTER — Other Ambulatory Visit: Payer: Self-pay | Admitting: Family Medicine

## 2020-04-30 ENCOUNTER — Other Ambulatory Visit (INDEPENDENT_AMBULATORY_CARE_PROVIDER_SITE_OTHER): Payer: Medicare Other

## 2020-04-30 ENCOUNTER — Other Ambulatory Visit: Payer: Self-pay

## 2020-04-30 ENCOUNTER — Telehealth: Payer: Self-pay | Admitting: Family Medicine

## 2020-04-30 DIAGNOSIS — R7303 Prediabetes: Secondary | ICD-10-CM

## 2020-04-30 DIAGNOSIS — E785 Hyperlipidemia, unspecified: Secondary | ICD-10-CM

## 2020-04-30 NOTE — Telephone Encounter (Signed)
That's fine. Ty.  

## 2020-04-30 NOTE — Telephone Encounter (Signed)
Patient informed. 

## 2020-04-30 NOTE — Telephone Encounter (Signed)
Last Prolia shot was on 11/22/2019

## 2020-04-30 NOTE — Telephone Encounter (Signed)
Pt came in office and stated has an appt on 05-22-2020, pt is wanting to know if ok to get her prolia shot on this same day of her appt. Please advise.

## 2020-04-30 NOTE — Telephone Encounter (Signed)
Ok to do at appointment!

## 2020-05-01 LAB — COMPREHENSIVE METABOLIC PANEL
AG Ratio: 1.8 (calc) (ref 1.0–2.5)
ALT: 14 U/L (ref 6–29)
AST: 17 U/L (ref 10–35)
Albumin: 4.4 g/dL (ref 3.6–5.1)
Alkaline phosphatase (APISO): 35 U/L — ABNORMAL LOW (ref 37–153)
BUN: 20 mg/dL (ref 7–25)
CO2: 28 mmol/L (ref 20–32)
Calcium: 9.1 mg/dL (ref 8.6–10.4)
Chloride: 107 mmol/L (ref 98–110)
Creat: 0.72 mg/dL (ref 0.60–0.93)
Globulin: 2.5 g/dL (calc) (ref 1.9–3.7)
Glucose, Bld: 91 mg/dL (ref 65–99)
Potassium: 4.3 mmol/L (ref 3.5–5.3)
Sodium: 140 mmol/L (ref 135–146)
Total Bilirubin: 0.5 mg/dL (ref 0.2–1.2)
Total Protein: 6.9 g/dL (ref 6.1–8.1)

## 2020-05-01 LAB — HEMOGLOBIN A1C
Hgb A1c MFr Bld: 6 % of total Hgb — ABNORMAL HIGH (ref <5.7)
Mean Plasma Glucose: 126 mg/dL
eAG (mmol/L): 7 mmol/L

## 2020-05-01 LAB — LIPID PANEL
Cholesterol: 182 mg/dL (ref <200)
HDL: 76 mg/dL (ref >=50)
LDL Cholesterol (Calc): 89 mg/dL (calc)
Non-HDL Cholesterol (Calc): 106 mg/dL (calc) (ref <130)
Total CHOL/HDL Ratio: 2.4 (calc) (ref <5.0)
Triglycerides: 76 mg/dL (ref <150)

## 2020-05-07 ENCOUNTER — Ambulatory Visit: Payer: Medicare Other | Admitting: Family Medicine

## 2020-05-22 ENCOUNTER — Other Ambulatory Visit (HOSPITAL_BASED_OUTPATIENT_CLINIC_OR_DEPARTMENT_OTHER): Payer: Self-pay | Admitting: Family Medicine

## 2020-05-22 ENCOUNTER — Ambulatory Visit (INDEPENDENT_AMBULATORY_CARE_PROVIDER_SITE_OTHER): Payer: Medicare Other | Admitting: Family Medicine

## 2020-05-22 ENCOUNTER — Other Ambulatory Visit: Payer: Self-pay

## 2020-05-22 ENCOUNTER — Encounter: Payer: Self-pay | Admitting: Family Medicine

## 2020-05-22 VITALS — BP 122/68 | HR 76 | Temp 98.3°F | Resp 16 | Ht 61.0 in | Wt 165.5 lb

## 2020-05-22 DIAGNOSIS — E785 Hyperlipidemia, unspecified: Secondary | ICD-10-CM | POA: Diagnosis not present

## 2020-05-22 DIAGNOSIS — M818 Other osteoporosis without current pathological fracture: Secondary | ICD-10-CM

## 2020-05-22 DIAGNOSIS — H663X1 Other chronic suppurative otitis media, right ear: Secondary | ICD-10-CM | POA: Diagnosis not present

## 2020-05-22 DIAGNOSIS — I10 Essential (primary) hypertension: Secondary | ICD-10-CM | POA: Diagnosis not present

## 2020-05-22 DIAGNOSIS — R7303 Prediabetes: Secondary | ICD-10-CM

## 2020-05-22 DIAGNOSIS — Z1231 Encounter for screening mammogram for malignant neoplasm of breast: Secondary | ICD-10-CM

## 2020-05-22 MED ORDER — CEFDINIR 300 MG PO CAPS
300.0000 mg | ORAL_CAPSULE | Freq: Two times a day (BID) | ORAL | 0 refills | Status: AC
Start: 2020-05-22 — End: 2020-05-29

## 2020-05-22 MED ORDER — DENOSUMAB 60 MG/ML ~~LOC~~ SOSY
60.0000 mg | PREFILLED_SYRINGE | Freq: Once | SUBCUTANEOUS | Status: AC
  Administered 2020-05-22: 60 mg via SUBCUTANEOUS

## 2020-05-22 NOTE — Patient Instructions (Addendum)
Keep the diet clean and stay active.  If the medicine does not help your ear, please make an appointment with your ENT team.  Let us know if you need anything.

## 2020-05-22 NOTE — Progress Notes (Signed)
Chief Complaint  Patient presents with  . Follow-up    Subjective Patricia Hansen is a 80 y.o. female who presents for hypertension follow up. She does not monitor home blood pressures. She is compliant with medications. Patient has these side effects of medication: none She is adhering to a healthy diet overall. Current exercise: golfing No CP or SOB.   Hyperlipidemia Patient presents for dyslipidemia follow up. Currently diet controlled.  She denies myalgias. Diet/exercise as above.  The patient is not known to have coexisting coronary artery disease.  Chronic otitis The patient has a history of chronic/recurrent right ear infections.  Over the last few weeks she has been having increasing pain.  No drainage, fevers, upper respiratory symptoms.  She was given an antibiotic in the past by the ENT team and told to follow-up for possible tube placement if no improvement.  She was off put by this idea and never followed up.   Past Medical History:  Diagnosis Date  . Arthritis   . Dyspepsia    chronic s/p EGD negative 2009  . History of Helicobacter infection    treated x 2  . Hx of colonoscopy 06/2005   no polyps found, next colonoscopy 2017  . Hypertension   . Intolerance of drug    biophosphates  . Osteopenia     Exam BP 122/68 (BP Location: Right Arm, Patient Position: Sitting, Cuff Size: Small)   Pulse 76   Temp 98.3 F (36.8 C) (Oral)   Resp 16   Ht 5\' 1"  (1.549 m)   Wt 165 lb 8 oz (75.1 kg)   SpO2 97%   BMI 31.27 kg/m  General:  well developed, well nourished, in no apparent distress Ears: Left ear negative, right ear: Canal patent, purulence noted behind the TM with erythema, slight bulging Heart: RRR, no bruits, trace pitting LE edema bilaterally Lungs: clear to auscultation, no accessory muscle use Psych: well oriented with normal range of affect and appropriate judgment/insight  Essential hypertension  Hyperlipidemia, unspecified hyperlipidemia type -  Plan: Comprehensive metabolic panel, Lipid panel  Prediabetes - Plan: Hemoglobin A1c  Chronic suppurative otitis media of right ear, unspecified otitis media location - Plan: cefdinir (OMNICEF) 300 MG capsule  Other osteoporosis without current pathological fracture - Plan: denosumab (PROLIA) injection 60 mg  1.  Continue Benicar 20 mg daily, hydrochlorothiazide 12.5 mg daily.  Counseled on diet and exercise. 2/3.  Check labs.  Continue diet control. 4.  7 days of Omnicef.  If no improvement, she will need to reach out to her ENT provider. F/u in 6 months or as needed. The patient voiced understanding and agreement to the plan.  [place], DO 05/22/20  1:51 PM

## 2020-06-05 ENCOUNTER — Inpatient Hospital Stay (HOSPITAL_BASED_OUTPATIENT_CLINIC_OR_DEPARTMENT_OTHER): Admission: RE | Admit: 2020-06-05 | Payer: Medicare Other | Source: Ambulatory Visit

## 2020-06-11 ENCOUNTER — Other Ambulatory Visit: Payer: Self-pay

## 2020-06-11 ENCOUNTER — Encounter (HOSPITAL_BASED_OUTPATIENT_CLINIC_OR_DEPARTMENT_OTHER): Payer: Self-pay

## 2020-06-11 ENCOUNTER — Ambulatory Visit (HOSPITAL_BASED_OUTPATIENT_CLINIC_OR_DEPARTMENT_OTHER)
Admission: RE | Admit: 2020-06-11 | Discharge: 2020-06-11 | Disposition: A | Discharge status: Home / Self Care | Payer: Medicare Other | Source: Ambulatory Visit | Attending: Family Medicine | Admitting: Family Medicine

## 2020-06-11 DIAGNOSIS — M818 Other osteoporosis without current pathological fracture: Secondary | ICD-10-CM | POA: Diagnosis not present

## 2020-06-11 DIAGNOSIS — Z1231 Encounter for screening mammogram for malignant neoplasm of breast: Secondary | ICD-10-CM | POA: Diagnosis not present

## 2020-06-11 DIAGNOSIS — Z780 Asymptomatic menopausal state: Secondary | ICD-10-CM | POA: Diagnosis not present

## 2020-06-14 ENCOUNTER — Other Ambulatory Visit: Payer: Self-pay | Admitting: Family Medicine

## 2020-06-14 DIAGNOSIS — R928 Other abnormal and inconclusive findings on diagnostic imaging of breast: Secondary | ICD-10-CM

## 2020-06-25 DIAGNOSIS — H60331 Swimmer's ear, right ear: Secondary | ICD-10-CM | POA: Diagnosis not present

## 2020-06-25 DIAGNOSIS — H9071 Mixed conductive and sensorineural hearing loss, unilateral, right ear, with unrestricted hearing on the contralateral side: Secondary | ICD-10-CM | POA: Diagnosis not present

## 2020-07-10 ENCOUNTER — Other Ambulatory Visit: Payer: Self-pay

## 2020-07-10 ENCOUNTER — Ambulatory Visit
Admission: RE | Admit: 2020-07-10 | Discharge: 2020-07-10 | Disposition: A | Discharge status: Home / Self Care | Payer: Medicare Other | Source: Ambulatory Visit | Attending: Family Medicine | Admitting: Family Medicine

## 2020-07-10 DIAGNOSIS — R928 Other abnormal and inconclusive findings on diagnostic imaging of breast: Secondary | ICD-10-CM

## 2020-07-10 DIAGNOSIS — R922 Inconclusive mammogram: Secondary | ICD-10-CM | POA: Diagnosis not present

## 2020-07-17 DIAGNOSIS — H60331 Swimmer's ear, right ear: Secondary | ICD-10-CM | POA: Diagnosis not present

## 2020-07-17 DIAGNOSIS — H9071 Mixed conductive and sensorineural hearing loss, unilateral, right ear, with unrestricted hearing on the contralateral side: Secondary | ICD-10-CM | POA: Diagnosis not present

## 2020-10-15 DIAGNOSIS — H9071 Mixed conductive and sensorineural hearing loss, unilateral, right ear, with unrestricted hearing on the contralateral side: Secondary | ICD-10-CM | POA: Diagnosis not present

## 2020-10-15 DIAGNOSIS — H60331 Swimmer's ear, right ear: Secondary | ICD-10-CM | POA: Diagnosis not present

## 2020-10-22 ENCOUNTER — Encounter: Payer: Self-pay | Admitting: Family Medicine

## 2020-10-22 ENCOUNTER — Telehealth (INDEPENDENT_AMBULATORY_CARE_PROVIDER_SITE_OTHER): Payer: Medicare Other | Admitting: Family Medicine

## 2020-10-22 ENCOUNTER — Other Ambulatory Visit: Payer: Self-pay

## 2020-10-22 DIAGNOSIS — J069 Acute upper respiratory infection, unspecified: Secondary | ICD-10-CM

## 2020-10-22 MED ORDER — BENZONATATE 200 MG PO CAPS: 200.0000 mg | ORAL_CAPSULE | Freq: Two times a day (BID) | ORAL | 0 refills | Status: DC | PRN

## 2020-10-22 NOTE — Progress Notes (Signed)
Chief Complaint  Patient presents with   Cough    Jefm Petty here for URI complaints. Due to COVID-19 pandemic, we are interacting via telephone. I verified patient's ID using 2 identifiers. Patient agreed to proceed with visit via this method. Patient is at home, I am at office. Patient and I are present for visit.   Duration: 4 days  Associated symptoms: sinus headache and coughing Denies: sinus congestion, sinus pain, rhinorrhea, itchy watery eyes, ear pain, ear drainage, sore throat, wheezing, shortness of breath, myalgia, and fevers Treatment to date: benzonatate Sick contacts: No Has not tested for covid.   Past Medical History:  Diagnosis Date   Arthritis    Dyspepsia    chronic s/p EGD negative 0623   History of Helicobacter infection    treated x 2   Hx of colonoscopy 06/2005   no polyps found, next colonoscopy 2017   Hypertension    Intolerance of drug    biophosphates   Osteopenia     Objective No conversational dyspnea Age appropriate judgment and insight Nml affect and mood  Viral URI with cough - Plan: benzonatate (TESSALON) 200 MG capsule  Consider testing for covid. Continue to push fluids, practice good hand hygiene, cover mouth when coughing. F/u prn. If starting to experience fevers, shaking, or shortness of breath, seek immediate care. Total time: 8 min Pt voiced understanding and agreement to the plan.  [place], DO 10/22/20 11:50 AM

## 2020-10-26 ENCOUNTER — Ambulatory Visit: Payer: Medicare Other

## 2020-10-30 ENCOUNTER — Telehealth: Payer: Self-pay | Admitting: Family Medicine

## 2020-10-30 DIAGNOSIS — J069 Acute upper respiratory infection, unspecified: Secondary | ICD-10-CM

## 2020-10-30 MED ORDER — BENZONATATE 200 MG PO CAPS: 200.0000 mg | ORAL_CAPSULE | Freq: Two times a day (BID) | ORAL | 0 refills | Status: DC | PRN

## 2020-10-30 NOTE — Addendum Note (Signed)
Addended by: Sharon Seller B on: 10/30/2020 02:05 PM   Modules accepted: Orders

## 2020-10-30 NOTE — Telephone Encounter (Signed)
Ok to refill 

## 2020-10-30 NOTE — Telephone Encounter (Signed)
Medication:  benzonatate (TESSALON) 200 MG capsule [[id number]]    Has the patient contacted their pharmacy? No. (If no, request that the patient contact the pharmacy for the refill.) (If yes, when and what did the pharmacy advise?)    [facility] phone [facility] [address]    Agent: Please be advised that RX refills may take up to 3 business days. We ask that you follow-up with your pharmacy.

## 2020-11-09 ENCOUNTER — Telehealth: Payer: Self-pay | Admitting: Family Medicine

## 2020-11-09 DIAGNOSIS — J069 Acute upper respiratory infection, unspecified: Secondary | ICD-10-CM

## 2020-11-09 MED ORDER — BENZONATATE 200 MG PO CAPS: 200.0000 mg | ORAL_CAPSULE | Freq: Two times a day (BID) | ORAL | 0 refills | Status: DC | PRN

## 2020-11-09 NOTE — Telephone Encounter (Signed)
Called pt and left voicemail to notify him of medication and he will need an appt for more refills

## 2020-11-09 NOTE — Telephone Encounter (Signed)
Medication:  benzonatate (TESSALON) 200 MG capsule  Has the patient contacted their pharmacy? No. (If no, request that the patient contact the pharmacy for the refill.) (If yes, when and what did the pharmacy advise?)     Preferred Pharmacy (with phone number or street name): [id number], [place] - [address]  [place], [place] [address]  Phone:  [phone]  Fax:  [phone]      Agent: Please be advised that RX refills may take up to 3 business days. We ask that you follow-up with your pharmacy.

## 2020-11-09 NOTE — Telephone Encounter (Signed)
Last visit 10/22/20, ok to refill or needs an appt

## 2020-11-12 ENCOUNTER — Telehealth: Payer: Self-pay | Admitting: Family Medicine

## 2020-11-12 NOTE — Telephone Encounter (Signed)
Pt came in office stating has an appt on 11-23-2020 with provider and wanted to make sure that she can get her prolia shot for this day, pt wanted to make sure that there is an approval and shot ready for her appt. Please advise.

## 2020-11-13 ENCOUNTER — Telehealth: Payer: Self-pay | Admitting: Family Medicine

## 2020-11-13 ENCOUNTER — Other Ambulatory Visit (INDEPENDENT_AMBULATORY_CARE_PROVIDER_SITE_OTHER): Payer: Medicare Other

## 2020-11-13 ENCOUNTER — Other Ambulatory Visit: Payer: Self-pay

## 2020-11-13 DIAGNOSIS — E785 Hyperlipidemia, unspecified: Secondary | ICD-10-CM | POA: Diagnosis not present

## 2020-11-13 DIAGNOSIS — R7303 Prediabetes: Secondary | ICD-10-CM

## 2020-11-13 LAB — COMPREHENSIVE METABOLIC PANEL
ALT: 16 U/L (ref 0–35)
AST: 17 U/L (ref 0–37)
Albumin: 4.2 g/dL (ref 3.5–5.2)
Alkaline Phosphatase: 32 U/L — ABNORMAL LOW (ref 39–117)
BUN: 19 mg/dL (ref 6–23)
CO2: 29 mEq/L (ref 19–32)
Calcium: 8.8 mg/dL (ref 8.4–10.5)
Chloride: 104 mEq/L (ref 96–112)
Creatinine, Ser: 0.67 mg/dL (ref 0.40–1.20)
GFR: 83.49 mL/min (ref >60.00)
Glucose, Bld: 105 mg/dL — ABNORMAL HIGH (ref 70–99)
Potassium: 4.2 mEq/L (ref 3.5–5.1)
Sodium: 139 mEq/L (ref 135–145)
Total Bilirubin: 0.5 mg/dL (ref 0.2–1.2)
Total Protein: 6.8 g/dL (ref 6.0–8.3)

## 2020-11-13 LAB — LIPID PANEL
Cholesterol: 186 mg/dL (ref 0–200)
HDL: 59.7 mg/dL (ref >39.00)
LDL Cholesterol: 97 mg/dL (ref 0–99)
NonHDL: 126.06
Total CHOL/HDL Ratio: 3
Triglycerides: 146 mg/dL (ref 0.0–149.0)
VLDL: 29.2 mg/dL (ref 0.0–40.0)

## 2020-11-13 LAB — HEMOGLOBIN A1C: Hgb A1c MFr Bld: 6.3 % (ref 4.6–6.5)

## 2020-11-13 MED ORDER — FLUTICASONE PROPIONATE HFA 44 MCG/ACT IN AERO: 2.0000 | INHALATION_SPRAY | Freq: Two times a day (BID) | RESPIRATORY_TRACT | 1 refills | Status: DC

## 2020-11-13 NOTE — Addendum Note (Signed)
Addended by: Kelle Darting A on: 11/13/2020 07:41 AM   Modules accepted: Orders

## 2020-11-13 NOTE — Telephone Encounter (Signed)
Pt came in office for labs and stated had spoken to provider about getting a new med for her cough, pt mentioned meds that she is taking for her cough is not working and wanting to get a different one. Please advise.   [id number] [place] - [address] [place] [address]  Phone:  [phone]  Fax:  [phone]  DEA #:  [id number]

## 2020-11-15 NOTE — Telephone Encounter (Signed)
Clinical information submitted to Insurance for Prolia waiting on summary of benefits. - Received 10/19/20

## 2020-11-16 ENCOUNTER — Other Ambulatory Visit: Payer: Medicare Other

## 2020-11-23 ENCOUNTER — Ambulatory Visit: Payer: Medicare Other | Admitting: Family Medicine

## 2020-11-23 ENCOUNTER — Ambulatory Visit (INDEPENDENT_AMBULATORY_CARE_PROVIDER_SITE_OTHER): Payer: Medicare Other | Admitting: Family Medicine

## 2020-11-23 ENCOUNTER — Encounter: Payer: Self-pay | Admitting: Family Medicine

## 2020-11-23 ENCOUNTER — Other Ambulatory Visit: Payer: Self-pay

## 2020-11-23 VITALS — BP 138/78 | HR 70 | Temp 98.7°F | Ht 61.0 in | Wt 166.0 lb

## 2020-11-23 DIAGNOSIS — J069 Acute upper respiratory infection, unspecified: Secondary | ICD-10-CM | POA: Diagnosis not present

## 2020-11-23 DIAGNOSIS — M818 Other osteoporosis without current pathological fracture: Secondary | ICD-10-CM | POA: Diagnosis not present

## 2020-11-23 DIAGNOSIS — I10 Essential (primary) hypertension: Secondary | ICD-10-CM

## 2020-11-23 DIAGNOSIS — R7303 Prediabetes: Secondary | ICD-10-CM | POA: Diagnosis not present

## 2020-11-23 DIAGNOSIS — E785 Hyperlipidemia, unspecified: Secondary | ICD-10-CM | POA: Diagnosis not present

## 2020-11-23 MED ORDER — CIPROFLOXACIN-DEXAMETHASONE 0.3-0.1 % OT SUSP: 4.0000 [drp] | Freq: Two times a day (BID) | OTIC | 0 refills | Status: DC

## 2020-11-23 MED ORDER — BENZONATATE 200 MG PO CAPS: 200.0000 mg | ORAL_CAPSULE | Freq: Two times a day (BID) | ORAL | 0 refills | Status: DC | PRN

## 2020-11-23 MED ORDER — DENOSUMAB 60 MG/ML ~~LOC~~ SOSY
60.0000 mg | PREFILLED_SYRINGE | Freq: Once | SUBCUTANEOUS | Status: AC
Start: 2020-11-23 — End: 2020-11-23
  Administered 2020-11-23: 60 mg via SUBCUTANEOUS

## 2020-11-23 MED ORDER — METFORMIN HCL 500 MG PO TABS
500.0000 mg | ORAL_TABLET | Freq: Two times a day (BID) | ORAL | 5 refills | Status: DC
Start: 2020-11-23 — End: 2021-05-24

## 2020-11-23 NOTE — Progress Notes (Signed)
Chief Complaint  Patient presents with   Follow-up    Subjective Patricia Hansen is a 80 y.o. female who presents for hypertension follow up. She does not monitor home blood pressures. She is compliant with medications- olmesartan 20 mg/d. HCTZ 12.5 mg/d. Patient has these side effects of medication: none She is not adhering to a healthy diet overall lately. Current exercise: walking, golfing No CP or SOB.   Hyperlipidemia Patient presents for dyslipidemia follow up. Currently being treated with no meds, diet controlled.  Diet/exercise as above.  The patient is not known to have coexisting coronary artery disease.  Prediabetes A1c increased to 6.3 up from 6.0. Diet/exercise as above. She is not on any glucose-lowering medication.    Past Medical History:  Diagnosis Date   Arthritis    Dyspepsia    chronic s/p EGD negative 1696   History of Helicobacter infection    treated x 2   Hx of colonoscopy 06/2005   no polyps found, next colonoscopy 2017   Hypertension    Intolerance of drug    biophosphates   Osteopenia     Exam BP 138/78   Pulse 70   Temp 98.7 F (37.1 C) (Oral)   Ht 5\' 1"  (1.549 m)   Wt 166 lb (75.3 kg)   SpO2 98%   BMI 31.37 kg/m  General:  well developed, well nourished, in no apparent distress Heart: RRR, no bruits, no LE edema Lungs: clear to auscultation, no accessory muscle use Psych: well oriented with normal range of affect and appropriate judgment/insight  Essential hypertension  Hyperlipidemia, unspecified hyperlipidemia type - Plan: Comprehensive metabolic panel, Lipid panel  Prediabetes - Plan: Hemoglobin A1c, metFORMIN (GLUCOPHAGE) 500 MG tablet  Chronic, stable. Cont HCTZ 12.5 mg/d, olmesartan 20 mg/d. Counseled on diet and exercise. Chronic, stable. Cont diet/exercise. Ck in 6 mo. Chronic, worsening. Will start metformin 500 mg bid. Monitor A1c.  Covid bivalent booster rec'd.  F/u in 6 mo. The patient voiced understanding and  agreement to the plan.  [place], DO 11/23/20  1:55 PM

## 2020-11-23 NOTE — Patient Instructions (Signed)
Keep the diet clean and stay active.  I recommend getting the updated bivalent covid vaccination booster at your convenience.   Let us know if you need anything.

## 2020-11-23 NOTE — Telephone Encounter (Signed)
Approval provided for Prolia. Patient was given her Prolia today 11/23/2020.

## 2021-01-03 ENCOUNTER — Other Ambulatory Visit: Payer: Self-pay | Admitting: Family Medicine

## 2021-01-16 DIAGNOSIS — Z23 Encounter for immunization: Secondary | ICD-10-CM | POA: Diagnosis not present

## 2021-03-08 ENCOUNTER — Other Ambulatory Visit: Payer: Self-pay | Admitting: Family Medicine

## 2021-04-08 ENCOUNTER — Telehealth: Payer: Self-pay

## 2021-04-08 NOTE — Telephone Encounter (Signed)
Prolia VOB initiated via [email or web address] ? ?Last OV:  ?Next OV:  ?Last Prolia inj: 11/23/20 ?Next Prolia inj DUE: 05/24/21 ? ?

## 2021-04-18 NOTE — Telephone Encounter (Addendum)
Pt ready for scheduling on or after 05/24/21 ? ?Out-of-pocket cost due at time of visit: $0 ? ?Primary: Medicare ?Prolia co-insurance: 20% (approximately $276) ?Admin fee co-insurance: 20% (approximately $25) ? ?Secondary: AARP Medicare Supp ?Prolia co-insurance: covers the Medicare Part B ?deductible, co-insurance and 100% of the excess charges ?Admin fee co-insurance: covers the Medicare Part B ?deductible, co-insurance and 100% of the excess charges ? ?Deductible: $0 of $226 met (covered by secondary) ? ?Prior Auth: not required ?PA# ?Valid:  ? ?** This summary of benefits is an estimation of the patient's out-of-pocket cost. Exact cost may vary based on individual plan coverage.  ? ?

## 2021-04-23 NOTE — Telephone Encounter (Signed)
Pt due for next injection on 05/23/21.  ?

## 2021-04-23 NOTE — Telephone Encounter (Signed)
Pt has been scheduled.  °

## 2021-04-24 ENCOUNTER — Ambulatory Visit (INDEPENDENT_AMBULATORY_CARE_PROVIDER_SITE_OTHER): Payer: Medicare Other

## 2021-04-24 DIAGNOSIS — M818 Other osteoporosis without current pathological fracture: Secondary | ICD-10-CM

## 2021-04-24 MED ORDER — DENOSUMAB 60 MG/ML ~~LOC~~ SOSY
60.0000 mg | PREFILLED_SYRINGE | Freq: Once | SUBCUTANEOUS | Status: AC
  Administered 2021-04-24: 60 mg via SUBCUTANEOUS

## 2021-04-24 NOTE — Progress Notes (Signed)
MERVE HOTARD is a 81 y.o. female presents to the office today for Prolia  injections, per physician's orders. ?Original order: Dr. Nani Ravens ?  '60mg'$  (dose), Left Arm  (route) was administered today. Patient tolerated injection. ? ?Adriana Mccallum Lonie Rummell  ?

## 2021-05-02 DIAGNOSIS — H25013 Cortical age-related cataract, bilateral: Secondary | ICD-10-CM | POA: Diagnosis not present

## 2021-05-07 DIAGNOSIS — H25812 Combined forms of age-related cataract, left eye: Secondary | ICD-10-CM | POA: Diagnosis not present

## 2021-05-07 DIAGNOSIS — Z01818 Encounter for other preprocedural examination: Secondary | ICD-10-CM | POA: Diagnosis not present

## 2021-05-07 DIAGNOSIS — H25811 Combined forms of age-related cataract, right eye: Secondary | ICD-10-CM | POA: Diagnosis not present

## 2021-05-09 DIAGNOSIS — H9071 Mixed conductive and sensorineural hearing loss, unilateral, right ear, with unrestricted hearing on the contralateral side: Secondary | ICD-10-CM | POA: Diagnosis not present

## 2021-05-09 DIAGNOSIS — H60331 Swimmer's ear, right ear: Secondary | ICD-10-CM | POA: Diagnosis not present

## 2021-05-21 ENCOUNTER — Other Ambulatory Visit (INDEPENDENT_AMBULATORY_CARE_PROVIDER_SITE_OTHER): Payer: Medicare Other

## 2021-05-21 DIAGNOSIS — E785 Hyperlipidemia, unspecified: Secondary | ICD-10-CM | POA: Diagnosis not present

## 2021-05-21 DIAGNOSIS — R7303 Prediabetes: Secondary | ICD-10-CM

## 2021-05-21 LAB — COMPREHENSIVE METABOLIC PANEL
ALT: 19 U/L (ref 0–35)
AST: 19 U/L (ref 0–37)
Albumin: 4.3 g/dL (ref 3.5–5.2)
Alkaline Phosphatase: 33 U/L — ABNORMAL LOW (ref 39–117)
BUN: 24 mg/dL — ABNORMAL HIGH (ref 6–23)
CO2: 30 mEq/L (ref 19–32)
Calcium: 9.3 mg/dL (ref 8.4–10.5)
Chloride: 105 mEq/L (ref 96–112)
Creatinine, Ser: 0.83 mg/dL (ref 0.40–1.20)
GFR: 67.09 mL/min (ref >60.00)
Glucose, Bld: 98 mg/dL (ref 70–99)
Potassium: 4.5 mEq/L (ref 3.5–5.1)
Sodium: 141 mEq/L (ref 135–145)
Total Bilirubin: 0.6 mg/dL (ref 0.2–1.2)
Total Protein: 6.8 g/dL (ref 6.0–8.3)

## 2021-05-21 LAB — LIPID PANEL
Cholesterol: 186 mg/dL (ref 0–200)
HDL: 71.2 mg/dL (ref >39.00)
LDL Cholesterol: 97 mg/dL (ref 0–99)
NonHDL: 114.99
Total CHOL/HDL Ratio: 3
Triglycerides: 88 mg/dL (ref 0.0–149.0)
VLDL: 17.6 mg/dL (ref 0.0–40.0)

## 2021-05-21 LAB — HEMOGLOBIN A1C: Hgb A1c MFr Bld: 6.2 % (ref 4.6–6.5)

## 2021-05-22 DIAGNOSIS — H2511 Age-related nuclear cataract, right eye: Secondary | ICD-10-CM | POA: Diagnosis not present

## 2021-05-22 DIAGNOSIS — H25811 Combined forms of age-related cataract, right eye: Secondary | ICD-10-CM | POA: Diagnosis not present

## 2021-05-24 ENCOUNTER — Ambulatory Visit (INDEPENDENT_AMBULATORY_CARE_PROVIDER_SITE_OTHER): Payer: Medicare Other | Admitting: Family Medicine

## 2021-05-24 ENCOUNTER — Encounter: Payer: Self-pay | Admitting: Family Medicine

## 2021-05-24 ENCOUNTER — Telehealth: Payer: Self-pay | Admitting: Family Medicine

## 2021-05-24 VITALS — BP 112/68 | HR 49 | Temp 98.0°F | Ht 61.0 in | Wt 166.0 lb

## 2021-05-24 DIAGNOSIS — E785 Hyperlipidemia, unspecified: Secondary | ICD-10-CM

## 2021-05-24 DIAGNOSIS — I10 Essential (primary) hypertension: Secondary | ICD-10-CM | POA: Diagnosis not present

## 2021-05-24 DIAGNOSIS — R1013 Epigastric pain: Secondary | ICD-10-CM

## 2021-05-24 DIAGNOSIS — R7303 Prediabetes: Secondary | ICD-10-CM | POA: Diagnosis not present

## 2021-05-24 MED ORDER — BENZONATATE 200 MG PO CAPS: 200.0000 mg | ORAL_CAPSULE | Freq: Two times a day (BID) | ORAL | 0 refills | Status: DC | PRN

## 2021-05-24 MED ORDER — OLMESARTAN MEDOXOMIL 20 MG PO TABS: 20.0000 mg | ORAL_TABLET | Freq: Every day | ORAL | 3 refills | Status: DC

## 2021-05-24 MED ORDER — BETAMETHASONE VALERATE 0.1 % EX CREA: TOPICAL_CREAM | Freq: Two times a day (BID) | CUTANEOUS | 1 refills | Status: DC

## 2021-05-24 MED ORDER — CREON 24000-76000 UNITS PO CPEP: ORAL_CAPSULE | ORAL | 0 refills | Status: DC

## 2021-05-24 NOTE — Progress Notes (Signed)
Chief Complaint  Patient presents with   Follow-up    6 month     Subjective Patricia Hansen is a 81 y.o. female who presents for hypertension follow up. She does not monitor home blood pressures. She is compliant with medications- HCTZ 12.5 mg/d, Benicar 20 mg/d. Patient has these side effects of medication: none She is adhering to a healthy diet overall. Current exercise: walking, golfing No Cp or SOB.   Dyspepsia History of chronic/recurring dyspepsia.  She takes Creon as needed.  This worked surprisingly well despite no official diagnosis of chronic pancreatitis.  She denies any adverse effects with this medication.   Past Medical History:  Diagnosis Date   Arthritis    Dyspepsia    chronic s/p EGD negative 6948   History of Helicobacter infection    treated x 2   Hx of colonoscopy 06/2005   no polyps found, next colonoscopy 2017   Hypertension    Intolerance of drug    biophosphates   Osteopenia     Exam BP 112/68   Pulse (!) 49   Temp 98 F (36.7 C) (Oral)   Ht '5\' 1"'$  (1.549 m)   Wt 166 lb (75.3 kg)   SpO2 97%   BMI 31.37 kg/m  General:  well developed, well nourished, in no apparent distress Heart: Regular rhythm, bradycardic, no bruits, no LE edema Lungs: clear to auscultation, no accessory muscle use Abdomen: Bowel sounds present, soft, nontender, nondistended Psych: well oriented with normal range of affect and appropriate judgment/insight  Essential hypertension - Plan: olmesartan (BENICAR) 20 MG tablet  Dyspepsia - Plan: Pancrelipase, Lip-Prot-Amyl, (CREON) 24000-76000 units CPEP  Prediabetes - Plan: Hemoglobin A1c  Hyperlipidemia, unspecified hyperlipidemia type - Plan: Comprehensive metabolic panel, Lipid panel  Chronic, stable.  Continue Benicar 20 mg daily, hydrochlorothiazide 12.5 mg daily.  Counseled on diet and exercise. Chronic, stable.  Continue Creon as needed. Monitor A1c.  She did not tolerate metformin. F/u in 6 mo. The patient voiced  understanding and agreement to the plan.  [place], DO 05/24/21  2:31 PM

## 2021-05-24 NOTE — Telephone Encounter (Signed)
Patient will be contacted when her next Prolia is due,  once authorization is completed for cost, patient will be called then to schedule her appt. If cost works out for her to continue getting her Prolia. The patient verbalized understanding.

## 2021-05-24 NOTE — Patient Instructions (Signed)
Keep the diet clean and stay active.  Let us know if you need anything. 

## 2021-05-24 NOTE — Telephone Encounter (Signed)
Pt is needing approval for her Prolia shot - pt states is due every 6 months. Please advise.

## 2021-06-07 NOTE — Telephone Encounter (Signed)
Last Prolia inj 04/24/21 Next Prolia inj due 10/25/21

## 2021-07-25 ENCOUNTER — Other Ambulatory Visit: Payer: Self-pay | Admitting: Family Medicine

## 2021-07-25 DIAGNOSIS — R1013 Epigastric pain: Secondary | ICD-10-CM

## 2021-09-14 NOTE — Telephone Encounter (Signed)
Prolia VOB initiated via [email or web address] 

## 2021-09-25 NOTE — Telephone Encounter (Signed)
Pt ready for scheduling on or after 10/25/21   Out-of-pocket cost due at time of visit: $0   Primary: Medicare Prolia co-insurance: 20% (approximately $276) Admin fee co-insurance: 20% (approximately $25)   Secondary: AARP Medicare Supp Prolia co-insurance: covers the Medicare Part B deductible, co-insurance and 100% of the excess charges Admin fee co-insurance: covers the Medicare Part B deductible, co-insurance and 100% of the excess charges   Deductible: $0 of $226 met (covered by secondary)   Prior Auth: not required PA# Valid:    ** This summary of benefits is an estimation of the patient's out-of-pocket cost. Exact cost may vary based on individual plan coverage.

## 2021-09-26 NOTE — Telephone Encounter (Signed)
My Chart message sent

## 2021-10-01 ENCOUNTER — Other Ambulatory Visit: Payer: Self-pay | Admitting: Family Medicine

## 2021-10-01 ENCOUNTER — Ambulatory Visit (INDEPENDENT_AMBULATORY_CARE_PROVIDER_SITE_OTHER): Payer: Medicare Other | Admitting: Family Medicine

## 2021-10-01 ENCOUNTER — Encounter: Payer: Self-pay | Admitting: Family Medicine

## 2021-10-01 VITALS — BP 140/84 | HR 69 | Temp 98.4°F | Ht 60.0 in | Wt 166.2 lb

## 2021-10-01 DIAGNOSIS — R7303 Prediabetes: Secondary | ICD-10-CM

## 2021-10-01 DIAGNOSIS — J029 Acute pharyngitis, unspecified: Secondary | ICD-10-CM | POA: Diagnosis not present

## 2021-10-01 DIAGNOSIS — H6591 Unspecified nonsuppurative otitis media, right ear: Secondary | ICD-10-CM | POA: Diagnosis not present

## 2021-10-01 LAB — POC COVID19 BINAXNOW

## 2021-10-01 MED ORDER — AMOXICILLIN 875 MG PO TABS: 875.0000 mg | ORAL_TABLET | Freq: Two times a day (BID) | ORAL | 0 refills | Status: AC

## 2021-10-01 NOTE — Progress Notes (Signed)
Chief Complaint  Patient presents with   Sore Throat   Headache    Jefm Petty here for URI complaints.  Duration: 2 days  Associated symptoms: sinus headache and sore throat Denies: sinus congestion, sinus pain, rhinorrhea, itchy watery eyes, ear pain, ear drainage, sore throat, wheezing, shortness of breath, myalgia, and fevers, N/V/D, coughing Treatment to date: none Sick contacts: No  Past Medical History:  Diagnosis Date   Arthritis    Dyspepsia    chronic s/p EGD negative 9021   History of Helicobacter infection    treated x 2   Hx of colonoscopy 06/2005   no polyps found, next colonoscopy 2017   Hypertension    Intolerance of drug    biophosphates   Osteopenia     Objective BP (!) 140/84 (BP Location: Left Arm, Patient Position: Sitting, Cuff Size: Normal)   Pulse 69   Temp 98.4 F (36.9 C) (Oral)   Ht 5' (1.524 m)   Wt 166 lb 4 oz (75.4 kg)   SpO2 97%   BMI 32.47 kg/m  General: Awake, alert, appears stated age HEENT: AT, [place], ears patent b/l and TM neg on L, erythematous w purulent effusion noted on R, nares patent w/o discharge, pharynx pink and without exudates, MMM Neck: No masses or asymmetry Heart: RRR Lungs: CTAB, no accessory muscle use Psych: Age appropriate judgment and insight, normal mood and affect  Sore throat - Plan: POC COVID-19  Fluid level behind tympanic membrane of right ear - Plan: amoxicillin (AMOXIL) 875 MG tablet  Tested neg for covid. Throat actually looks good. Will send amox for ear if no improvement in next 2-3 days. Consider throat lozenges, salt water gargles and an air humidifier for symptomatic care. Continue to push fluids, practice good hand hygiene, cover mouth when coughing. F/u prn. If starting to experience fevers, shaking, or shortness of breath, seek immediate care. Pt voiced understanding and agreement to the plan.  [place], DO 10/01/21 4:22 PM

## 2021-10-01 NOTE — Patient Instructions (Addendum)
Continue to push fluids, practice good hand hygiene, and cover your mouth if you cough.  If you start having fevers, shaking or shortness of breath, seek immediate care.  OK to take Tylenol 1000 mg (2 extra strength tabs) or 975 mg (3 regular strength tabs) every 6 hours as needed.  Wait a few days. If no improvement, use the antibiotic.  Consider throat lozenges, salt water gargles and an air humidifier for symptomatic care.   Let us know if you need anything.

## 2021-10-10 ENCOUNTER — Telehealth: Payer: Self-pay

## 2021-10-10 NOTE — Telephone Encounter (Signed)
Pt is due for next Prolia injection on or after 10/25/21   Out-of-pocket cost due at time of visit: $0.   Called to schedule- noticed flu shot is set for 10/17/21, wanted to see if pt was willing to push that NV date back so that she can get both on the same day.

## 2021-10-11 ENCOUNTER — Other Ambulatory Visit: Payer: Self-pay | Admitting: Family Medicine

## 2021-10-17 ENCOUNTER — Ambulatory Visit: Payer: Medicare Other

## 2021-10-25 ENCOUNTER — Telehealth: Payer: Self-pay

## 2021-10-25 ENCOUNTER — Ambulatory Visit (INDEPENDENT_AMBULATORY_CARE_PROVIDER_SITE_OTHER): Payer: Medicare Other

## 2021-10-25 ENCOUNTER — Encounter: Payer: Self-pay | Admitting: Family Medicine

## 2021-10-25 ENCOUNTER — Telehealth: Payer: Self-pay | Admitting: Family Medicine

## 2021-10-25 DIAGNOSIS — M810 Age-related osteoporosis without current pathological fracture: Secondary | ICD-10-CM | POA: Diagnosis not present

## 2021-10-25 DIAGNOSIS — Z23 Encounter for immunization: Secondary | ICD-10-CM | POA: Diagnosis not present

## 2021-10-25 DIAGNOSIS — R7303 Prediabetes: Secondary | ICD-10-CM

## 2021-10-25 MED ORDER — BENZONATATE 200 MG PO CAPS: ORAL_CAPSULE | ORAL | 0 refills | Status: DC

## 2021-10-25 MED ORDER — DENOSUMAB 60 MG/ML ~~LOC~~ SOSY
60.0000 mg | PREFILLED_SYRINGE | Freq: Once | SUBCUTANEOUS | Status: AC
  Administered 2021-10-25: 60 mg via SUBCUTANEOUS

## 2021-10-25 NOTE — Telephone Encounter (Signed)
Pt came in office and  stated is needing refill on Benzonatate [id number] [place], [place] [id number]: [phone] Fax: [phone] #: [id number]. Please advise.

## 2021-10-25 NOTE — Telephone Encounter (Signed)
Please update Notebook.

## 2021-10-25 NOTE — Addendum Note (Signed)
Addended by: Kittie Plater, Edu On HUA on: 10/25/2021 09:28 AM   Modules accepted: Orders

## 2021-10-25 NOTE — Progress Notes (Signed)
Pt has come in for HD flu shot and Prolia shot. Both given in left arm and tolerated well.

## 2021-10-25 NOTE — Telephone Encounter (Signed)
Pt has come in for the Prolia shot and she has tolerated well on left arm. She is aware that her next injection in anytime after 04/27/22. She has the information for it.

## 2021-10-27 NOTE — Telephone Encounter (Signed)
Creft, Darlis Loan, CMA  You 2 days ago    Please update Notebook.       Note    Kittie Plater, [employer], [place] routed conversation to You; Creft, Darlis Loan, CMA 2 days ago   Kittie Plater, [employer], [place] 2 days ago    Pt has come in for the Prolia shot and she has tolerated well on left arm. She is aware that her next injection in anytime after 04/27/22. She has the information for it.

## 2021-11-12 DIAGNOSIS — H9071 Mixed conductive and sensorineural hearing loss, unilateral, right ear, with unrestricted hearing on the contralateral side: Secondary | ICD-10-CM | POA: Diagnosis not present

## 2021-11-12 DIAGNOSIS — H60331 Swimmer's ear, right ear: Secondary | ICD-10-CM | POA: Diagnosis not present

## 2021-11-18 ENCOUNTER — Other Ambulatory Visit (INDEPENDENT_AMBULATORY_CARE_PROVIDER_SITE_OTHER): Payer: Medicare Other

## 2021-11-18 DIAGNOSIS — E785 Hyperlipidemia, unspecified: Secondary | ICD-10-CM

## 2021-11-18 DIAGNOSIS — R7303 Prediabetes: Secondary | ICD-10-CM

## 2021-11-18 LAB — COMPREHENSIVE METABOLIC PANEL
ALT: 19 U/L (ref 0–35)
AST: 19 U/L (ref 0–37)
Albumin: 4.2 g/dL (ref 3.5–5.2)
Alkaline Phosphatase: 36 U/L — ABNORMAL LOW (ref 39–117)
BUN: 17 mg/dL (ref 6–23)
CO2: 30 mEq/L (ref 19–32)
Calcium: 8.9 mg/dL (ref 8.4–10.5)
Chloride: 104 mEq/L (ref 96–112)
Creatinine, Ser: 0.72 mg/dL (ref 0.40–1.20)
GFR: 79.3 mL/min (ref >60.00)
Glucose, Bld: 93 mg/dL (ref 70–99)
Potassium: 4.5 mEq/L (ref 3.5–5.1)
Sodium: 139 mEq/L (ref 135–145)
Total Bilirubin: 0.5 mg/dL (ref 0.2–1.2)
Total Protein: 6.8 g/dL (ref 6.0–8.3)

## 2021-11-18 LAB — LIPID PANEL
Cholesterol: 171 mg/dL (ref 0–200)
HDL: 58.4 mg/dL (ref >39.00)
LDL Cholesterol: 86 mg/dL (ref 0–99)
NonHDL: 112.24
Total CHOL/HDL Ratio: 3
Triglycerides: 131 mg/dL (ref 0.0–149.0)
VLDL: 26.2 mg/dL (ref 0.0–40.0)

## 2021-11-18 LAB — HEMOGLOBIN A1C: Hgb A1c MFr Bld: 6.4 % (ref 4.6–6.5)

## 2021-11-25 ENCOUNTER — Ambulatory Visit: Payer: Medicare Other | Admitting: Family Medicine

## 2021-11-25 ENCOUNTER — Ambulatory Visit (INDEPENDENT_AMBULATORY_CARE_PROVIDER_SITE_OTHER): Payer: Medicare Other | Admitting: Family Medicine

## 2021-11-25 ENCOUNTER — Encounter: Payer: Self-pay | Admitting: Family Medicine

## 2021-11-25 VITALS — BP 128/78 | HR 72 | Temp 97.0°F | Ht 60.0 in | Wt 163.4 lb

## 2021-11-25 DIAGNOSIS — R7303 Prediabetes: Secondary | ICD-10-CM | POA: Diagnosis not present

## 2021-11-25 DIAGNOSIS — E785 Hyperlipidemia, unspecified: Secondary | ICD-10-CM

## 2021-11-25 DIAGNOSIS — I10 Essential (primary) hypertension: Secondary | ICD-10-CM

## 2021-11-25 MED ORDER — OLMESARTAN MEDOXOMIL 20 MG PO TABS: 20.0000 mg | ORAL_TABLET | Freq: Every day | ORAL | 3 refills | Status: DC

## 2021-11-25 MED ORDER — HYDROCHLOROTHIAZIDE 12.5 MG PO TABS: 12.5000 mg | ORAL_TABLET | Freq: Every day | ORAL | 3 refills | Status: DC

## 2021-11-25 NOTE — Progress Notes (Signed)
Chief Complaint  Patient presents with   Follow-up    Refills for one year      Subjective Patricia Hansen is a 81 y.o. female who presents for hypertension follow up. She does not monitor home blood pressures. She is compliant with medications- HCTZ 12.5 mg/d, olmesartan 20 mg/d. Patient has these side effects of medication: none She is adhering to a healthy diet overall. Current exercise: golfing No Cp or SOB.   Prediabetes A1c was 6.4 recently, diet/exercise as above.  He was 6.2 at last check 6 months ago.  No known family history of diabetes.  She is not on any glucose lowering medications.   Past Medical History:  Diagnosis Date   Arthritis    Dyspepsia    chronic s/p EGD negative 4827   History of Helicobacter infection    treated x 2   Hx of colonoscopy 06/2005   no polyps found, next colonoscopy 2017   Hypertension    Intolerance of drug    biophosphates   Osteopenia     Exam BP 128/78 (BP Location: Left Arm, Patient Position: Sitting, Cuff Size: Normal)   Pulse 72   Temp (!) 97 F (36.1 C) (Oral)   Ht 5' (1.524 m)   Wt 163 lb 6 oz (74.1 kg)   SpO2 97%   BMI 31.91 kg/m  General:  well developed, well nourished, in no apparent distress Heart: RRR, no bruits, 1+ pitting bilateral LE edema tapering at the proximal third of the tibia Lungs: clear to auscultation, no accessory muscle use Psych: well oriented with normal range of affect and appropriate judgment/insight  Essential hypertension - Plan: hydrochlorothiazide (HYDRODIURIL) 12.5 MG tablet, olmesartan (BENICAR) 20 MG tablet  Prediabetes - Plan: Hemoglobin A1c  Hyperlipidemia, unspecified hyperlipidemia type - Plan: Comprehensive metabolic panel, Lipid panel  Chronic, stable. Cont Benicar 20 mg/d, HCTZ 12.5 mg/d. Counseled on diet and exercise. Chronic, worsening.  I did offer to start metformin which she politely declined.  Counseled on diet and exercise.  Recommended she add walking to her golfing  regimen. F/u in 6 mo. we will obtain labs 1 week prior to the visit. The patient voiced understanding and agreement to the plan.  [place], DO 11/25/21  8:51 AM

## 2021-11-25 NOTE — Telephone Encounter (Signed)
Last Prolia inj 10/25/21 Next Prolia inj due 04/27/22

## 2021-11-25 NOTE — Patient Instructions (Signed)
Keep the diet clean and stay active.  Let me know if you change your mind about the medication to prevent diabetes.  Let us know if you need anything.

## 2022-03-27 ENCOUNTER — Other Ambulatory Visit (HOSPITAL_COMMUNITY): Payer: Self-pay

## 2022-03-27 NOTE — Telephone Encounter (Signed)
Prolia VOB initiated via [email or web address]  Last OV:  Next OV:  Last Prolia inj: 10/25/21 Next Prolia inj DUE:  04/27/22  Status is holding for future verification

## 2022-04-09 NOTE — Telephone Encounter (Signed)
Checking status.

## 2022-04-11 NOTE — Telephone Encounter (Signed)
Pt ready for scheduling for Prolia 60mg  on or after : 04/27/22  Out-of-pocket cost due at time of visit: $0  Primary: Medicare Prolia co-insurance: 0% Admin fee co-insurance: 0%  Secondary: [employer] Supp Prolia co-insurance:  Admin fee co-insurance:   Medical Benefit Details: Date Benefits were checked: 03/28/22 Deductible: $0 met of $240 Required (covered by secondary)/ Coinsurance: 0%/ Admin Fee: 0%  Prior Auth: N/A PA# Expiration Date:    Pharmacy benefit: Copay $--- If patient wants fill through the pharmacy benefit please send prescription to:  --- , and include estimated need by date in rx notes. Pharmacy will ship medication directly to the office.  Patient NOT eligible for Prolia Copay Card. Copay Card can make patient's cost as little as $25. Link to apply: [email or web address]://www.[email or web address]  ** This summary of benefits is an estimation of the patient's out-of-pocket cost. Exact cost may very based on individual plan coverage.

## 2022-04-11 NOTE — Telephone Encounter (Signed)
Pt due for Prolia, I will call to schedule- But also wanted to make sure Dr. Carmelia Roller was aware that the pt was establishing at [facility] in August.

## 2022-04-23 NOTE — Telephone Encounter (Signed)
Left message on machine to call back  

## 2022-04-23 NOTE — Telephone Encounter (Signed)
Patient called wanting to set up her Prolia shot, appointment made. Is this ok? What will OOP cost be?

## 2022-04-24 NOTE — Telephone Encounter (Signed)
See 04/09/22 telephone encounter.

## 2022-04-29 ENCOUNTER — Telehealth: Payer: Self-pay | Admitting: *Deleted

## 2022-04-29 ENCOUNTER — Ambulatory Visit: Payer: Medicare Other

## 2022-04-29 NOTE — Telephone Encounter (Signed)
[name] for patient to call back about prolia.  Saw that appt was cancelled.  If pt calls pt let her know it will be $0 OOP. 

## 2022-04-29 NOTE — Telephone Encounter (Signed)
[name] for patient to call back about prolia.  Saw that appt was cancelled.  If pt calls pt let her know it will be $0 OOP.

## 2022-05-05 NOTE — Progress Notes (Unsigned)
Patricia Hansen is a 82 y.o. female presents to the office today for Prolia injection, per physician's orders. Prolia 60 mg SQ was administered L arm- posterior today. Patient tolerated injection. Patient due for follow up labs/provider appt: No- appt is pending. Patient next injection due: 6 months, appt made No- will schedule in 5 months.   Creft, Feliberto Harts

## 2022-05-06 ENCOUNTER — Telehealth: Payer: Self-pay

## 2022-05-06 ENCOUNTER — Ambulatory Visit (INDEPENDENT_AMBULATORY_CARE_PROVIDER_SITE_OTHER): Payer: Medicare Other

## 2022-05-06 DIAGNOSIS — M810 Age-related osteoporosis without current pathological fracture: Secondary | ICD-10-CM | POA: Diagnosis not present

## 2022-05-06 MED ORDER — DENOSUMAB 60 MG/ML ~~LOC~~ SOSY
60.0000 mg | PREFILLED_SYRINGE | Freq: Once | SUBCUTANEOUS | Status: AC
  Administered 2022-05-06: 60 mg via SUBCUTANEOUS

## 2022-05-06 NOTE — Telephone Encounter (Signed)
Pt received prolia today.  Next injection due 11/05/22- pt may be transferring to Dr Okey Dupre

## 2022-05-19 ENCOUNTER — Other Ambulatory Visit (INDEPENDENT_AMBULATORY_CARE_PROVIDER_SITE_OTHER): Payer: Medicare Other

## 2022-05-19 DIAGNOSIS — E785 Hyperlipidemia, unspecified: Secondary | ICD-10-CM | POA: Diagnosis not present

## 2022-05-19 DIAGNOSIS — R7303 Prediabetes: Secondary | ICD-10-CM

## 2022-05-19 LAB — COMPREHENSIVE METABOLIC PANEL
ALT: 15 U/L (ref 0–35)
AST: 17 U/L (ref 0–37)
Albumin: 4 g/dL (ref 3.5–5.2)
Alkaline Phosphatase: 35 U/L — ABNORMAL LOW (ref 39–117)
BUN: 17 mg/dL (ref 6–23)
CO2: 30 mEq/L (ref 19–32)
Calcium: 8.8 mg/dL (ref 8.4–10.5)
Chloride: 104 mEq/L (ref 96–112)
Creatinine, Ser: 0.68 mg/dL (ref 0.40–1.20)
GFR: 82.31 mL/min (ref >60.00)
Glucose, Bld: 87 mg/dL (ref 70–99)
Potassium: 4.2 mEq/L (ref 3.5–5.1)
Sodium: 141 mEq/L (ref 135–145)
Total Bilirubin: 0.5 mg/dL (ref 0.2–1.2)
Total Protein: 6.7 g/dL (ref 6.0–8.3)

## 2022-05-19 LAB — LIPID PANEL
Cholesterol: 179 mg/dL (ref 0–200)
HDL: 62.3 mg/dL (ref >39.00)
LDL Cholesterol: 83 mg/dL (ref 0–99)
NonHDL: 116.21
Total CHOL/HDL Ratio: 3
Triglycerides: 167 mg/dL — ABNORMAL HIGH (ref 0.0–149.0)
VLDL: 33.4 mg/dL (ref 0.0–40.0)

## 2022-05-19 LAB — HEMOGLOBIN A1C: Hgb A1c MFr Bld: 6.3 % (ref 4.6–6.5)

## 2022-05-26 ENCOUNTER — Encounter: Payer: Self-pay | Admitting: Family Medicine

## 2022-05-26 ENCOUNTER — Ambulatory Visit (INDEPENDENT_AMBULATORY_CARE_PROVIDER_SITE_OTHER): Payer: Medicare Other | Admitting: Family Medicine

## 2022-05-26 ENCOUNTER — Ambulatory Visit: Payer: Medicare Other | Admitting: Family Medicine

## 2022-05-26 VITALS — BP 128/76 | HR 57 | Temp 98.0°F | Ht 60.0 in | Wt 165.2 lb

## 2022-05-26 DIAGNOSIS — R1013 Epigastric pain: Secondary | ICD-10-CM | POA: Diagnosis not present

## 2022-05-26 DIAGNOSIS — R7303 Prediabetes: Secondary | ICD-10-CM

## 2022-05-26 DIAGNOSIS — I10 Essential (primary) hypertension: Secondary | ICD-10-CM

## 2022-05-26 MED ORDER — HYDROCHLOROTHIAZIDE 12.5 MG PO TABS: 12.5000 mg | ORAL_TABLET | Freq: Every day | ORAL | 3 refills | Status: DC

## 2022-05-26 MED ORDER — OLMESARTAN MEDOXOMIL 20 MG PO TABS: 20.0000 mg | ORAL_TABLET | Freq: Every day | ORAL | 3 refills | Status: DC

## 2022-05-26 MED ORDER — CREON 24000-76000 UNITS PO CPEP: ORAL_CAPSULE | ORAL | 3 refills | Status: DC

## 2022-05-26 MED ORDER — BETAMETHASONE VALERATE 0.1 % EX CREA: TOPICAL_CREAM | Freq: Two times a day (BID) | CUTANEOUS | 2 refills | Status: DC

## 2022-05-26 NOTE — Progress Notes (Signed)
Chief Complaint  Patient presents with   Follow-up    6 month     Subjective Patricia Hansen is a 82 y.o. female who presents for hypertension follow up. She does not monitor home blood pressures. She is compliant with medications- Hydrochlorothiazide 12.5 mg daily, olmesartan 20 mg daily Patient has these side effects of medication: none She is adhering to a healthy diet overall. Current exercise: Walking, golf No CP or SOB.    Past Medical History:  Diagnosis Date   Arthritis    Dyspepsia    chronic s/p EGD negative 2009   History of Helicobacter infection    treated x 2   Hx of colonoscopy 06/2005   no polyps found, next colonoscopy 2017   Hypertension    Intolerance of drug    biophosphates   Osteopenia     Exam BP 128/76 (BP Location: Right Arm, Patient Position: Sitting, Cuff Size: Normal)   Pulse (!) 57   Temp 98 F (36.7 C) (Oral)   Ht 5' (1.524 m)   Wt 165 lb 4 oz (75 kg)   SpO2 98%   BMI 32.27 kg/m  General:  well developed, well nourished, in no apparent distress Heart: RRR, no bruits, no LE edema Lungs: clear to auscultation, no accessory muscle use Psych: well oriented with normal range of affect and appropriate judgment/insight  Essential hypertension - Plan: Comprehensive metabolic panel, Lipid panel  Prediabetes - Plan: Hemoglobin A1c  Chronic, stable.  Continue olmesartan 20 mg daily, hydrochlorothiazide 12.5 mg daily. Counseled on diet and exercise. F/u in 6 months. The patient voiced understanding and agreement to the plan.  Jilda Roche [place], DO 05/26/22  1:54 PM

## 2022-06-11 DIAGNOSIS — R059 Cough, unspecified: Secondary | ICD-10-CM | POA: Diagnosis not present

## 2022-06-11 DIAGNOSIS — Z20822 Contact with and (suspected) exposure to covid-19: Secondary | ICD-10-CM | POA: Diagnosis not present

## 2022-06-11 DIAGNOSIS — J029 Acute pharyngitis, unspecified: Secondary | ICD-10-CM | POA: Diagnosis not present

## 2022-06-13 ENCOUNTER — Telehealth: Payer: Self-pay | Admitting: Family Medicine

## 2022-06-13 ENCOUNTER — Encounter: Payer: Self-pay | Admitting: Family

## 2022-06-13 ENCOUNTER — Ambulatory Visit (INDEPENDENT_AMBULATORY_CARE_PROVIDER_SITE_OTHER): Payer: Medicare Other | Admitting: Family

## 2022-06-13 VITALS — BP 126/70 | HR 67 | Temp 98.2°F | Ht 60.0 in | Wt 166.8 lb

## 2022-06-13 DIAGNOSIS — J9801 Acute bronchospasm: Secondary | ICD-10-CM

## 2022-06-13 DIAGNOSIS — J209 Acute bronchitis, unspecified: Secondary | ICD-10-CM | POA: Diagnosis not present

## 2022-06-13 MED ORDER — AZITHROMYCIN 250 MG PO TABS: ORAL_TABLET | ORAL | 0 refills | Status: AC

## 2022-06-13 MED ORDER — BENZONATATE 200 MG PO CAPS: 200.0000 mg | ORAL_CAPSULE | Freq: Three times a day (TID) | ORAL | 0 refills | Status: DC | PRN

## 2022-06-13 MED ORDER — PROMETHAZINE-DM 6.25-15 MG/5ML PO SYRP: 5.0000 mL | ORAL_SOLUTION | Freq: Four times a day (QID) | ORAL | 0 refills | Status: DC | PRN

## 2022-06-13 NOTE — Progress Notes (Signed)
Patricia Hansen is a 82 y.o. female with the following history as recorded in EpicCare:  Patient Active Problem List   Diagnosis Date Noted   Chronic suppurative otitis media of right ear 05/22/2020   Prediabetes 10/28/2019   Hyperlipidemia 10/28/2019   Allergic dermatitis 07/08/2011   ALLERGIC RHINITIS 01/18/2009   Dyspepsia 11/01/2007   Essential hypertension 10/04/2007   GERD 10/04/2007   Osteoporosis 07/28/2007    Current Outpatient Medications  Medication Sig Dispense Refill   azithromycin (ZITHROMAX) 250 MG tablet Take 2 tablets (500 mg total) by mouth daily for 1 day, THEN 1 tablet (250 mg total) daily for 4 days. 6 each 0   benzonatate (TESSALON) 200 MG capsule Take 1 capsule (200 mg total) by mouth 3 (three) times daily as needed for cough. 30 capsule 0   promethazine-dextromethorphan (PROMETHAZINE-DM) 6.25-15 MG/5ML syrup Take 5 mLs by mouth 4 (four) times daily as needed for cough. 118 mL 0   betamethasone valerate (VALISONE) 0.1 % cream Apply topically 2 (two) times daily. 45 g 2   hydrochlorothiazide (HYDRODIURIL) 12.5 MG tablet Take 1 tablet (12.5 mg total) by mouth daily. 90 tablet 3   olmesartan (BENICAR) 20 MG tablet Take 1 tablet (20 mg total) by mouth daily. 90 tablet 3   Pancrelipase, Lip-Prot-Amyl, (CREON) 24000-76000 units CPEP Take 1 capsule by mouth 3 times daily. 300 capsule 3   No current facility-administered medications for this visit.    Allergies: Patient has no known allergies.  Past Medical History:  Diagnosis Date   Arthritis    Dyspepsia    chronic s/p EGD negative 2009   History of Helicobacter infection    treated x 2   Hx of colonoscopy 06/2005   no polyps found, next colonoscopy 2017   Hypertension    Intolerance of drug    biophosphates   Osteopenia     Past Surgical History:  Procedure Laterality Date   BREAST EXCISIONAL BIOPSY Right 1980   COLONOSCOPY     TUBAL LIGATION     UPPER GASTROINTESTINAL ENDOSCOPY      Family History   Problem Relation Age of Onset   Uterine cancer Mother        deceased   Hypertension Father        deceased   Other Other        no history of breast cancer, diabetes or colon cancer   Colon polyps Neg Hx    Colon cancer Neg Hx    Esophageal cancer Neg Hx    Stomach cancer Neg Hx    Rectal cancer Neg Hx     Social History   Tobacco Use   Smoking status: Never   Smokeless tobacco: Never  Substance Use Topics   Alcohol use: Never    Subjective:   Cough x 2-3 days; + sore throat; seen at [facility]- negative COVID; +barking cough; using OTC Tylenol; no relief with Benzonatate;   Objective:  Vitals:   06/13/22 1426  BP: 126/70  Pulse: 67  Temp: 98.2 F (36.8 C)  TempSrc: Oral  SpO2: 96%  Weight: 166 lb 12.8 oz (75.7 kg)  Height: 5' (1.524 m)    General: Well developed, well nourished, in no acute distress  Skin : Warm and dry.  Head: Normocephalic and atraumatic  Eyes: Sclera and conjunctiva clear; pupils round and reactive to light; extraocular movements intact  Ears: External normal; canals clear; tympanic membranes normal  Oropharynx: Pink, supple. No suspicious lesions  Neck:  Supple without thyromegaly, adenopathy  Lungs: Respirations unlabored; clear to auscultation bilaterally without wheeze, rales, rhonchi  CVS exam: normal rate and regular rhythm.  Neurologic: Alert and oriented; speech intact; face symmetrical; moves all extremities well; CNII-XII intact without focal deficit   Assessment:  1. Acute bronchitis, unspecified organism   2. Acute bronchospasm     Plan:  Rx for Z-pak #1 take as directed; patient defers trial of prednisone at this time; Rx for Promethazine DM to use at night; increase fluids, rest and follow up worse, no better.   No follow-ups on file.  No orders of the defined types were placed in this encounter.   Requested Prescriptions   Signed Prescriptions Disp Refills   azithromycin (ZITHROMAX) 250 MG tablet 6 each 0    Sig: Take  2 tablets (500 mg total) by mouth daily for 1 day, THEN 1 tablet (250 mg total) daily for 4 days.   promethazine-dextromethorphan (PROMETHAZINE-DM) 6.25-15 MG/5ML syrup 118 mL 0    Sig: Take 5 mLs by mouth 4 (four) times daily as needed for cough.   benzonatate (TESSALON) 200 MG capsule 30 capsule 0    Sig: Take 1 capsule (200 mg total) by mouth 3 (three) times daily as needed for cough.

## 2022-06-13 NOTE — Telephone Encounter (Signed)
Error

## 2022-06-16 ENCOUNTER — Other Ambulatory Visit: Payer: Self-pay | Admitting: Family Medicine

## 2022-06-16 MED ORDER — PROMETHAZINE-DM 6.25-15 MG/5ML PO SYRP: 5.0000 mL | ORAL_SOLUTION | Freq: Four times a day (QID) | ORAL | 0 refills | Status: DC | PRN

## 2022-06-23 ENCOUNTER — Ambulatory Visit (INDEPENDENT_AMBULATORY_CARE_PROVIDER_SITE_OTHER): Payer: Medicare Other | Admitting: Family Medicine

## 2022-06-23 ENCOUNTER — Encounter: Payer: Self-pay | Admitting: Family Medicine

## 2022-06-23 ENCOUNTER — Ambulatory Visit (HOSPITAL_BASED_OUTPATIENT_CLINIC_OR_DEPARTMENT_OTHER)
Admission: RE | Admit: 2022-06-23 | Discharge: 2022-06-23 | Disposition: A | Discharge status: Home / Self Care | Payer: Medicare Other | Source: Ambulatory Visit | Attending: Family Medicine | Admitting: Family Medicine

## 2022-06-23 VITALS — BP 132/76 | HR 69 | Temp 99.0°F | Ht 60.0 in | Wt 165.0 lb

## 2022-06-23 DIAGNOSIS — R059 Cough, unspecified: Secondary | ICD-10-CM | POA: Diagnosis not present

## 2022-06-23 DIAGNOSIS — H6641 Suppurative otitis media, unspecified, right ear: Secondary | ICD-10-CM

## 2022-06-23 MED ORDER — AMOXICILLIN-POT CLAVULANATE 875-125 MG PO TABS
1.0000 | ORAL_TABLET | Freq: Two times a day (BID) | ORAL | 0 refills | Status: AC
Start: 2022-06-23 — End: 2022-06-30

## 2022-06-23 NOTE — Patient Instructions (Addendum)
We will be in touch regarding your X-ray results.   Continue to push fluids, practice good hand hygiene, and cover your mouth if you cough.  If you start having fevers, shaking or shortness of breath, seek immediate care.  OK to take Tylenol 1000 mg (2 extra strength tabs) or 975 mg (3 regular strength tabs) every 6 hours as needed.  Let me know if you are still having issues.   Let us know if you need anything.

## 2022-06-23 NOTE — Progress Notes (Signed)
Chief Complaint  Patient presents with   Cough    Patricia Hansen here for URI complaints.  Duration: 2 weeks  Associated symptoms: rhinorrhea and coughing Denies: sinus congestion, sinus pain, itchy watery eyes, ear pain, ear drainage, sore throat, wheezing, shortness of breath, myalgia, and fevers Treatment to date: cough syrup, Zpak Sick contacts: No  Past Medical History:  Diagnosis Date   Arthritis    Dyspepsia    chronic s/p EGD negative 2009   History of Helicobacter infection    treated x 2   Hx of colonoscopy 06/2005   no polyps found, next colonoscopy 2017   Hypertension    Intolerance of drug    biophosphates   Osteopenia     Objective BP 132/76 (BP Location: Left Arm, Cuff Size: Normal)   Pulse 69   Temp 99 F (37.2 C) (Oral)   Ht 5' (1.524 m)   Wt 165 lb (74.8 kg)   SpO2 97%   BMI 32.22 kg/m  General: Awake, alert, appears stated age HEENT: AT, [place], ears patent b/l and L TM neg, right TM with purulence and slight erythema behind the nares patent w/o discharge, pharynx pink and without exudates, MMM Neck: No masses or asymmetry Heart: RRR Lungs: CTAB, no accessory muscle use Psych: Age appropriate judgment and insight, normal mood and affect  Cough, unspecified type - Plan: DG Chest 2 View  Suppurative otitis media of right ear, unspecified chronicity - Plan: amoxicillin-clavulanate (AUGMENTIN) 875-125 MG tablet  7 days of Augmentin should help cover for both ear and lung issues.  Check a chest x-ray.  Continue to push fluids, practice good hand hygiene, cover mouth when coughing. F/u prn. If starting to experience fevers, shaking, or shortness of breath, seek immediate care. Pt voiced understanding and agreement to the plan.  Patricia Roche [place], DO 06/23/22 12:12 PM

## 2022-08-28 ENCOUNTER — Ambulatory Visit: Payer: Medicare Other | Admitting: Internal Medicine

## 2022-09-22 DIAGNOSIS — Z23 Encounter for immunization: Secondary | ICD-10-CM | POA: Diagnosis not present

## 2022-10-06 NOTE — Telephone Encounter (Signed)
Pt due 11/05/22 for Prolia.

## 2022-10-13 ENCOUNTER — Telehealth: Payer: Self-pay

## 2022-10-13 NOTE — Telephone Encounter (Signed)
Prolia VOB initiated via [email or web address]  Next Prolia inj DUE: 11/05/22

## 2022-10-13 NOTE — Telephone Encounter (Signed)
Created new encounter for Prolia BIV. Will route encounter back once benefit verification is complete.  

## 2022-10-17 DIAGNOSIS — M65312 Trigger thumb, left thumb: Secondary | ICD-10-CM | POA: Diagnosis not present

## 2022-10-17 NOTE — Telephone Encounter (Signed)
Pt ready for scheduling for PROLIA on or after : 11/05/22  Out-of-pocket cost due at time of visit: $0  Primary: MEDICARE Prolia co-insurance: 0% Admin fee co-insurance: 0%  Secondary: UHC MEDSUP Prolia co-insurance:  Admin fee co-insurance:   Medical Benefit Details: Date Benefits were checked: 10/13/22 Deductible: $240 MET OF $240 REQUIRED/ Coinsurance: 0%/ Admin Fee: 0%  Prior Auth: N/A PA# Expiration Date:   # of doses approved:  Pharmacy benefit: Copay $--- If patient wants fill through the pharmacy benefit please send prescription to:  --- , and include estimated need by date in rx notes. Pharmacy will ship medication directly to the office.  Patient NOT eligible for Prolia Copay Card. Copay Card can make patient's cost as little as $25. Link to apply: [email or web address]://[email or web address].[email or web address]  ** This summary of benefits is an estimation of the patient's out-of-pocket cost. Exact cost may very based on individual plan coverage.

## 2022-10-21 NOTE — Telephone Encounter (Signed)
Out-of-pocket cost due at time of visit: $0. Can you please call and let her know.

## 2022-10-21 NOTE — Telephone Encounter (Signed)
Called informed of her cost $0. Scheduled next Prolia 11/06/22 at 9:30 AM

## 2022-10-21 NOTE — Telephone Encounter (Signed)
Called the patient and no answer/no VM Called left message to call back.

## 2022-11-06 ENCOUNTER — Ambulatory Visit (INDEPENDENT_AMBULATORY_CARE_PROVIDER_SITE_OTHER): Payer: Medicare Other

## 2022-11-06 DIAGNOSIS — M810 Age-related osteoporosis without current pathological fracture: Secondary | ICD-10-CM | POA: Diagnosis not present

## 2022-11-06 MED ORDER — DENOSUMAB 60 MG/ML ~~LOC~~ SOSY
60.0000 mg | PREFILLED_SYRINGE | Freq: Once | SUBCUTANEOUS | Status: AC
Start: 2022-11-06 — End: 2022-11-06
  Administered 2022-11-06: 60 mg via SUBCUTANEOUS

## 2022-11-06 NOTE — Progress Notes (Signed)
Patricia Hansen is a 82 y.o. female presents to the office today for Prolia injection, per physician's orders. Prolia 60 mg SQ was administered L arm- posterior today. Patient tolerated injection. Patient due for follow up labs/provider appt: No- appt is pending.

## 2022-11-19 ENCOUNTER — Other Ambulatory Visit: Payer: Medicare Other

## 2022-11-24 ENCOUNTER — Other Ambulatory Visit (INDEPENDENT_AMBULATORY_CARE_PROVIDER_SITE_OTHER): Payer: Medicare Other

## 2022-11-24 DIAGNOSIS — I10 Essential (primary) hypertension: Secondary | ICD-10-CM

## 2022-11-24 DIAGNOSIS — R7303 Prediabetes: Secondary | ICD-10-CM | POA: Diagnosis not present

## 2022-11-24 LAB — LIPID PANEL
Cholesterol: 223 mg/dL — ABNORMAL HIGH (ref 0–200)
HDL: 65.2 mg/dL (ref >39.00)
LDL Cholesterol: 134 mg/dL — ABNORMAL HIGH (ref 0–99)
NonHDL: 157.54
Total CHOL/HDL Ratio: 3
Triglycerides: 119 mg/dL (ref 0.0–149.0)
VLDL: 23.8 mg/dL (ref 0.0–40.0)

## 2022-11-24 LAB — COMPREHENSIVE METABOLIC PANEL
ALT: 19 U/L (ref 0–35)
AST: 19 U/L (ref 0–37)
Albumin: 4.3 g/dL (ref 3.5–5.2)
Alkaline Phosphatase: 40 U/L (ref 39–117)
BUN: 16 mg/dL (ref 6–23)
CO2: 32 meq/L (ref 19–32)
Calcium: 9.1 mg/dL (ref 8.4–10.5)
Chloride: 103 meq/L (ref 96–112)
Creatinine, Ser: 0.76 mg/dL (ref 0.40–1.20)
GFR: 73.79 mL/min (ref >60.00)
Glucose, Bld: 91 mg/dL (ref 70–99)
Potassium: 4.8 meq/L (ref 3.5–5.1)
Sodium: 141 meq/L (ref 135–145)
Total Bilirubin: 0.6 mg/dL (ref 0.2–1.2)
Total Protein: 7 g/dL (ref 6.0–8.3)

## 2022-11-24 LAB — HEMOGLOBIN A1C: Hgb A1c MFr Bld: 6.3 % (ref 4.6–6.5)

## 2022-11-26 ENCOUNTER — Encounter: Payer: Self-pay | Admitting: Family Medicine

## 2022-11-26 ENCOUNTER — Ambulatory Visit (INDEPENDENT_AMBULATORY_CARE_PROVIDER_SITE_OTHER): Payer: Medicare Other | Admitting: Family Medicine

## 2022-11-26 VITALS — BP 128/78 | HR 62 | Temp 98.0°F | Resp 16 | Ht 61.0 in | Wt 168.8 lb

## 2022-11-26 DIAGNOSIS — I10 Essential (primary) hypertension: Secondary | ICD-10-CM

## 2022-11-26 DIAGNOSIS — H663X1 Other chronic suppurative otitis media, right ear: Secondary | ICD-10-CM | POA: Diagnosis not present

## 2022-11-26 DIAGNOSIS — R1013 Epigastric pain: Secondary | ICD-10-CM | POA: Diagnosis not present

## 2022-11-26 MED ORDER — CREON 24000-76000 UNITS PO CPEP: ORAL_CAPSULE | ORAL | 3 refills | Status: DC

## 2022-11-26 MED ORDER — AMOXICILLIN-POT CLAVULANATE 875-125 MG PO TABS
1.0000 | ORAL_TABLET | Freq: Two times a day (BID) | ORAL | 0 refills | Status: AC
Start: 2022-11-26 — End: 2022-12-03

## 2022-11-26 MED ORDER — FLUCONAZOLE 150 MG PO TABS: ORAL_TABLET | ORAL | 0 refills | Status: DC

## 2022-11-26 NOTE — Progress Notes (Signed)
Chief Complaint  Patient presents with   Follow-up    Follow up    Subjective Patricia Hansen is a 82 y.o. female who presents for hypertension follow up. She does not monitor home blood pressures. She is compliant with medications- hydrochlorothiazide 12.5 mg/d, Benicar 20 mg/d. Patient has these side effects of medication: none She is adhering to a healthy diet overall. Current exercise: walking, golf No CP or SOB.   HA- a little over a mo has been having a frontal headache. Worse in AM. No runny or stuffy nose, vision changes, difficulty w speech, trouble swallowing, jaw pain, neuro s/s's, N/V. Has not tried anything at home.   Hx of dyspepsia and pancreatic insufficiency. Uses Creon 1-2 times daily. Works well for her. No AE's. Compliant. No bleeding or difficulty swallowing.    She has a history of chronic suppurative otitis media.  It has been flaring in her right ear over the past couple weeks.  She is wondering if she needs another antibiotic.  She was referred to ENT but decided not to go.  No fevers or drainage.   Past Medical History:  Diagnosis Date   Arthritis    Dyspepsia    chronic s/p EGD negative 2009   History of Helicobacter infection    treated x 2   Hx of colonoscopy 06/2005   no polyps found, next colonoscopy 2017   Hypertension    Intolerance of drug    biophosphates   Osteopenia     Exam BP 128/78 (BP Location: Left Arm, Patient Position: Sitting, Cuff Size: Normal)   Pulse 62   Temp 98 F (36.7 C) (Oral)   Resp 16   Ht 5\' 1"  (1.549 m)   Wt 168 lb 12.8 oz (76.6 kg)   SpO2 96%   BMI 31.89 kg/m  General:  well developed, well nourished, in no apparent distress Heart: RRR, no bruits, 1+ pitting bilateral lower extremity edema tapering at the proximal third of the tibia Lungs: clear to auscultation, no accessory muscle use Neuro: DTRs equal and symmetric throughout, no clonus, no cerebellar signs, 5/5 strength throughout MSK: No TTP over the cervical  paraspinal musculature, suboccipital triangle, cervical midline, TMJ, temporalis, mild TTP over the frontal sinuses bilaterally Ears: Canals patent without otorrhea, left TM negative, right TM with suppurative effusion with slight erythema and loss of landmarks Psych: well oriented with normal range of affect and appropriate judgment/insight  Essential hypertension  Dyspepsia - Plan: Pancrelipase, Lip-Prot-Amyl, (CREON) 24000-76000 units CPEP  Gastroesophageal reflux disease, unspecified whether esophagitis present  Chronic suppurative otitis media of right ear, unspecified otitis media location - Plan: amoxicillin-clavulanate (AUGMENTIN) 875-125 MG tablet, fluconazole (DIFLUCAN) 150 MG tablet  Chronic, stable.  Continue hydrochlorothiazide 12.5 mg daily, olmesartan 20 mg daily.  Counseled on diet and exercise. Chronic, stable.  Continue Creon twice daily as needed. She has not been to the ENT as recommended, will consider going in 2025.  She will let me know if she changes her mind. Her headache could be related to stress as her husband was diagnosed with lymphoma.  Headache started shortly after.  No obvious trauma.  Could be related to the sinuses.  Will see if the Augmentin for the ear infection helps with this.  She will let me know if anything changes.  Neuroexam is unremarkable today.  No red flag signs/symptoms. F/u in 6 months. The patient voiced understanding and agreement to the plan.  Jilda Roche [place], DO 11/26/22  2:16 PM

## 2022-11-26 NOTE — Patient Instructions (Signed)
Keep the diet clean and stay active.  Let me know if the headache persists.   Let us know if you need anything.

## 2022-12-16 DIAGNOSIS — M65312 Trigger thumb, left thumb: Secondary | ICD-10-CM | POA: Diagnosis not present

## 2022-12-23 ENCOUNTER — Telehealth: Payer: Self-pay | Admitting: *Deleted

## 2022-12-23 DIAGNOSIS — M810 Age-related osteoporosis without current pathological fracture: Secondary | ICD-10-CM

## 2022-12-23 MED ORDER — DENOSUMAB 60 MG/ML ~~LOC~~ SOSY: 60.0000 mg | PREFILLED_SYRINGE | Freq: Once | SUBCUTANEOUS | Status: DC

## 2022-12-23 NOTE — Telephone Encounter (Signed)
Prolia given 11/06/22 Next Prolia due 05/08/23  CAM order placed

## 2023-01-01 ENCOUNTER — Other Ambulatory Visit: Payer: Self-pay

## 2023-01-01 DIAGNOSIS — L239 Allergic contact dermatitis, unspecified cause: Secondary | ICD-10-CM

## 2023-01-01 MED ORDER — BETAMETHASONE VALERATE 0.1 % EX CREA: TOPICAL_CREAM | Freq: Two times a day (BID) | CUTANEOUS | 2 refills | Status: DC

## 2023-01-05 ENCOUNTER — Telehealth: Payer: Self-pay | Admitting: Family Medicine

## 2023-01-05 MED ORDER — BENZONATATE 200 MG PO CAPS: 200.0000 mg | ORAL_CAPSULE | Freq: Two times a day (BID) | ORAL | 0 refills | Status: DC | PRN

## 2023-01-05 NOTE — Telephone Encounter (Signed)
Called pt was advised, appt scheduled for next weeks.

## 2023-01-05 NOTE — Telephone Encounter (Signed)
I sent some in. Please sched appt to follow up on persistent cough. Ty.

## 2023-01-05 NOTE — Telephone Encounter (Signed)
Pt came in office stating is having her cough again and wanted to know if possible for rx to be sent in benzonatate (TESSALON) 200 MG capsule sent to [id number] - [place], [place] - [address] [place] [address] Phone: [phone]  Fax: [phone] DEA #: [id number]. Please advise. Pt was informed that she is needing an appt for coughing issues still before meds, but pt wanted to verify with provider first for her meds. Pt tel [phone]

## 2023-01-06 ENCOUNTER — Other Ambulatory Visit: Payer: Self-pay | Admitting: Family Medicine

## 2023-01-06 MED ORDER — BENZONATATE 200 MG PO CAPS: 200.0000 mg | ORAL_CAPSULE | Freq: Two times a day (BID) | ORAL | 0 refills | Status: DC | PRN

## 2023-01-12 ENCOUNTER — Ambulatory Visit: Payer: Medicare Other | Admitting: Family Medicine

## 2023-01-19 ENCOUNTER — Ambulatory Visit (INDEPENDENT_AMBULATORY_CARE_PROVIDER_SITE_OTHER): Payer: Medicare Other | Admitting: Family Medicine

## 2023-01-19 ENCOUNTER — Encounter: Payer: Self-pay | Admitting: Family Medicine

## 2023-01-19 VITALS — BP 124/76 | HR 73 | Temp 98.0°F | Resp 16 | Ht 61.0 in | Wt 167.0 lb

## 2023-01-19 DIAGNOSIS — J069 Acute upper respiratory infection, unspecified: Secondary | ICD-10-CM

## 2023-01-19 MED ORDER — BENZONATATE 200 MG PO CAPS: 200.0000 mg | ORAL_CAPSULE | Freq: Three times a day (TID) | ORAL | 0 refills | Status: DC | PRN

## 2023-01-19 NOTE — Patient Instructions (Signed)
 Continue to push fluids, practice good hand hygiene, and cover your mouth if you cough. ? ?If you start having fevers, shaking or shortness of breath, seek immediate care. ? ?OK to take Tylenol 1000 mg (2 extra strength tabs) or 975 mg (3 regular strength tabs) every 6 hours as needed. ? ?Let us know if you need anything. ?

## 2023-01-19 NOTE — Progress Notes (Signed)
 Chief Complaint  Patient presents with   Cough    Discuss Cough   Patricia Hansen here for URI complaints.  Duration: 2 weeks; getting better Associated symptoms:  dry coughing Denies: sinus congestion, sinus pain, rhinorrhea, ear pain, ear drainage, sore throat, wheezing, shortness of breath, myalgia, and fevers Treatment to date: Tessalon  [place] Sick contacts: No  Past Medical History:  Diagnosis Date   Arthritis    Dyspepsia    chronic s/p EGD negative 2009   History of Helicobacter infection    treated x 2   Hx of colonoscopy 06/2005   no polyps found, next colonoscopy 2017   Hypertension    Intolerance of drug    biophosphates   Osteopenia     Objective BP 124/76   Pulse 73   Temp 98 F (36.7 C) (Oral)   Resp 16   Ht 5' 1 (1.549 m)   Wt 167 lb (75.8 kg)   SpO2 95%   BMI 31.55 kg/m  General: Awake, alert, appears stated age HEENT: AT, [place], ears patent b/l and TM's neg, nares patent w/o discharge, pharynx pink and without exudates, MMM, no sinus ttp Neck: No masses or asymmetry Heart: RRR Lungs: CTAB, no accessory muscle use Psych: Age appropriate judgment and insight, normal mood and affect  Viral URI with cough - Plan: benzonatate  (TESSALON ) 200 MG capsule  Continue to push fluids, practice good hand hygiene, cover mouth when coughing.  F/u prn. If starting to experience fevers, shaking, or shortness of breath, seek immediate care. Pt voiced understanding and agreement to the plan.  Patricia [place], DO 01/19/23 2:40 PM

## 2023-02-04 DIAGNOSIS — M65312 Trigger thumb, left thumb: Secondary | ICD-10-CM | POA: Diagnosis not present

## 2023-03-05 ENCOUNTER — Other Ambulatory Visit: Payer: Self-pay | Admitting: *Deleted

## 2023-03-05 DIAGNOSIS — M818 Other osteoporosis without current pathological fracture: Secondary | ICD-10-CM

## 2023-03-05 MED ORDER — DENOSUMAB 60 MG/ML ~~LOC~~ SOSY
60.0000 mg | PREFILLED_SYRINGE | SUBCUTANEOUS | Status: AC
  Administered 2023-05-12: 60 mg via SUBCUTANEOUS

## 2023-03-12 ENCOUNTER — Other Ambulatory Visit: Payer: Self-pay | Admitting: Family Medicine

## 2023-03-12 DIAGNOSIS — I10 Essential (primary) hypertension: Secondary | ICD-10-CM

## 2023-04-07 ENCOUNTER — Telehealth: Payer: Self-pay

## 2023-04-07 NOTE — Telephone Encounter (Signed)
 Pt ready for scheduling for Prolia on or after : 05/08/23  Option# 1: Buy/Bill (Office supplied medication)  Out-of-pocket cost due at time of clinic visit: $0  Number of injection/visits approved: --  Primary: [place] Medicare  Prolia co-insurance: 20% Admin fee co-insurance: 20%  Secondary: [employer] - MEDSUP Prolia co-insurance: 100% Admin fee co-insurance: 100%  Medical Benefit Details: Date Benefits were checked: 04/07/23 Deductible: $257 ($237.67 met)/ Coinsurance: 100%/ Admin Fee: 100%  Prior Auth: not required PA# Expiration Date:   # of doses approved: ----------------------------------------------------------------------- Option# 2- Med Obtained from pharmacy:  Pharmacy benefit: Copay $-- (Paid to pharmacy) Admin Fee: -- (Pay at clinic)  Prior Auth: -- PA# Expiration Date:   # of doses approved:   If patient wants fill through the pharmacy benefit please send prescription to:  -- , and include estimated need by date in rx notes. Pharmacy will ship medication directly to the office.  Patient not eligible for Prolia Copay Card. Copay Card can make patient's cost as little as $25. Link to apply: [email or web address]://www.[email or web address]  ** This summary of benefits is an estimation of the patient's out-of-pocket cost. Exact cost may very based on individual plan coverage.

## 2023-04-07 NOTE — Telephone Encounter (Signed)
 Marland Kitchen

## 2023-05-12 ENCOUNTER — Ambulatory Visit (INDEPENDENT_AMBULATORY_CARE_PROVIDER_SITE_OTHER): Admitting: *Deleted

## 2023-05-12 DIAGNOSIS — M818 Other osteoporosis without current pathological fracture: Secondary | ICD-10-CM | POA: Diagnosis not present

## 2023-05-12 MED ORDER — DENOSUMAB 60 MG/ML ~~LOC~~ SOSY
60.0000 mg | PREFILLED_SYRINGE | SUBCUTANEOUS | Status: AC
  Administered 2023-11-13: 60 mg via SUBCUTANEOUS

## 2023-05-12 NOTE — Addendum Note (Signed)
 Addended by: Marylou Sobers D on: 05/12/2023 11:35 AM   Modules accepted: Orders

## 2023-05-12 NOTE — Progress Notes (Signed)
 Patient here for Prolia  injection per physicians orders  Prolia  60 mg SQ , was administered left arm today. Patient tolerated injection.  Patient next injection due: 6 months, appt made:  No- will schedule in 5 months after benefits are ran again  Initial injection: no  Did Prolia  come from pharmacy (if yes please select patient supplied): no  Cam placed for next injection: yes

## 2023-05-19 ENCOUNTER — Other Ambulatory Visit: Payer: Self-pay

## 2023-05-19 ENCOUNTER — Telehealth: Payer: Self-pay | Admitting: Family Medicine

## 2023-05-19 DIAGNOSIS — R7303 Prediabetes: Secondary | ICD-10-CM

## 2023-05-19 DIAGNOSIS — I10 Essential (primary) hypertension: Secondary | ICD-10-CM

## 2023-05-19 NOTE — Telephone Encounter (Signed)
 Lab orders placed.

## 2023-05-19 NOTE — Telephone Encounter (Signed)
 Good morning I just need lab orders for this pt

## 2023-05-25 ENCOUNTER — Telehealth: Payer: Self-pay

## 2023-05-25 ENCOUNTER — Other Ambulatory Visit: Payer: Self-pay

## 2023-05-25 DIAGNOSIS — M818 Other osteoporosis without current pathological fracture: Secondary | ICD-10-CM

## 2023-05-25 NOTE — Telephone Encounter (Signed)
 done

## 2023-05-29 ENCOUNTER — Ambulatory Visit: Payer: Self-pay | Admitting: Family Medicine

## 2023-05-29 ENCOUNTER — Other Ambulatory Visit (INDEPENDENT_AMBULATORY_CARE_PROVIDER_SITE_OTHER): Payer: Medicare Other

## 2023-05-29 DIAGNOSIS — I10 Essential (primary) hypertension: Secondary | ICD-10-CM | POA: Diagnosis not present

## 2023-05-29 DIAGNOSIS — R7303 Prediabetes: Secondary | ICD-10-CM | POA: Diagnosis not present

## 2023-05-29 LAB — LIPID PANEL
Cholesterol: 196 mg/dL (ref 0–200)
HDL: 67.2 mg/dL (ref >39.00)
LDL Cholesterol: 106 mg/dL — ABNORMAL HIGH (ref 0–99)
NonHDL: 129.18
Total CHOL/HDL Ratio: 3
Triglycerides: 118 mg/dL (ref 0.0–149.0)
VLDL: 23.6 mg/dL (ref 0.0–40.0)

## 2023-05-29 LAB — COMPREHENSIVE METABOLIC PANEL WITH GFR
ALT: 17 U/L (ref 0–35)
AST: 18 U/L (ref 0–37)
Albumin: 4.3 g/dL (ref 3.5–5.2)
Alkaline Phosphatase: 37 U/L — ABNORMAL LOW (ref 39–117)
BUN: 20 mg/dL (ref 6–23)
CO2: 31 meq/L (ref 19–32)
Calcium: 9 mg/dL (ref 8.4–10.5)
Chloride: 105 meq/L (ref 96–112)
Creatinine, Ser: 0.7 mg/dL (ref 0.40–1.20)
GFR: 81.15 mL/min (ref >60.00)
Glucose, Bld: 94 mg/dL (ref 70–99)
Potassium: 4.6 meq/L (ref 3.5–5.1)
Sodium: 142 meq/L (ref 135–145)
Total Bilirubin: 0.5 mg/dL (ref 0.2–1.2)
Total Protein: 6.9 g/dL (ref 6.0–8.3)

## 2023-05-29 LAB — HEMOGLOBIN A1C: Hgb A1c MFr Bld: 6.3 % (ref 4.6–6.5)

## 2023-06-05 ENCOUNTER — Encounter: Payer: Self-pay | Admitting: Family Medicine

## 2023-06-05 ENCOUNTER — Ambulatory Visit: Payer: Medicare Other | Admitting: Family Medicine

## 2023-06-05 VITALS — BP 128/72 | HR 73 | Temp 98.0°F | Resp 16 | Ht 61.0 in | Wt 111.0 lb

## 2023-06-05 DIAGNOSIS — H663X1 Other chronic suppurative otitis media, right ear: Secondary | ICD-10-CM

## 2023-06-05 DIAGNOSIS — L239 Allergic contact dermatitis, unspecified cause: Secondary | ICD-10-CM | POA: Diagnosis not present

## 2023-06-05 DIAGNOSIS — R1013 Epigastric pain: Secondary | ICD-10-CM | POA: Diagnosis not present

## 2023-06-05 DIAGNOSIS — I10 Essential (primary) hypertension: Secondary | ICD-10-CM | POA: Diagnosis not present

## 2023-06-05 DIAGNOSIS — R7303 Prediabetes: Secondary | ICD-10-CM

## 2023-06-05 MED ORDER — BETAMETHASONE VALERATE 0.1 % EX CREA: TOPICAL_CREAM | Freq: Two times a day (BID) | CUTANEOUS | 2 refills | Status: DC

## 2023-06-05 MED ORDER — CIPROFLOXACIN-DEXAMETHASONE 0.3-0.1 % OT SUSP: 4.0000 [drp] | Freq: Two times a day (BID) | OTIC | 1 refills | Status: AC

## 2023-06-05 MED ORDER — OLMESARTAN MEDOXOMIL 20 MG PO TABS: 20.0000 mg | ORAL_TABLET | Freq: Every day | ORAL | 3 refills | Status: DC

## 2023-06-05 NOTE — Progress Notes (Signed)
 Chief Complaint  Patient presents with   Follow-up     Follow Up     Subjective Patricia Hansen is a 83 y.o. female who presents for hypertension follow up. She does not monitor home blood pressures. She is compliant with medications-olmesartan  20 mg daily, hydrochlorothiazide  12.5 mg daily. Patient has these side effects of medication: none She is usually adhering to a healthy diet overall. Current exercise: Golf No CP or SOB.   Has a history of chronic dyspepsia and possible pancreatic insufficiency.  She takes Creon  3 times daily as needed.  She reports compliance and no adverse effects.  She would like to continue taking this.  No bleeding, unintentional weight loss, nighttime awakenings.  Chronic right ear pain-this has been going on for several years.  She will get antibiotics or drops which helps for a very short period time and then symptoms return.  No drainage.  She is requesting a refill of Ciprodex .  She was sent to ENT in the past but decided not to go because the drops seemed to help.  She does not have an ENT doctor now.   Past Medical History:  Diagnosis Date   Arthritis    Dyspepsia    chronic s/p EGD negative 2009   History of Helicobacter infection    treated x 2   Hx of colonoscopy 06/2005   no polyps found, next colonoscopy 2017   Hypertension    Intolerance of drug    biophosphates   Osteopenia     Exam BP 128/72 (BP Location: Left Arm, Patient Position: Sitting)   Pulse 73   Temp 98 F (36.7 C) (Oral)   Resp 16   Ht 5\' 1"  (1.549 m)   Wt 111 lb (50.3 kg)   SpO2 98%   BMI 20.97 kg/m  General:  well developed, well nourished, in no apparent distress Heart: RRR, no bruits, no LE edema Lungs: clear to auscultation, no accessory muscle use Ears: Patent bilaterally, TM on the left negative.  TM on the right with purulent material behind.  No erythema of the TM. Psych: well oriented with normal range of affect and appropriate judgment/insight  Essential  hypertension - Plan: CBC, Comprehensive metabolic panel with GFR, Lipid panel, olmesartan  (BENICAR ) 20 MG tablet  Dyspepsia  Prediabetes - Plan: Hemoglobin A1c  Allergic dermatitis - Plan: betamethasone  valerate (VALISONE ) 0.1 % cream  Chronic suppurative otitis media of right ear, unspecified otitis media location - Plan: Ambulatory referral to ENT  Chronic, stable.  Continue hydrochlorothiazide  12.5 mg daily, olmesartan  20 mg daily.  Counseled on diet and exercise. Chronic, stable.  Continue Creon  3 times daily as needed. Monitor. Continue as needed. Refer to ENT. F/u in 6 months. The patient voiced understanding and agreement to the plan.  Shellie Dials Roxie, DO 06/05/23  2:05 PM

## 2023-06-05 NOTE — Patient Instructions (Addendum)
 Keep making healthy eating choices and stay physically active.  Take 1200 mg of calcium daily and at least 1000 units of vitamin D3 daily.   Let us  know if you need anything.

## 2023-06-08 ENCOUNTER — Telehealth: Payer: Self-pay

## 2023-06-08 NOTE — Telephone Encounter (Signed)
 Done

## 2023-08-04 DIAGNOSIS — M65312 Trigger thumb, left thumb: Secondary | ICD-10-CM | POA: Diagnosis not present

## 2023-08-13 ENCOUNTER — Ambulatory Visit (HOSPITAL_BASED_OUTPATIENT_CLINIC_OR_DEPARTMENT_OTHER)
Admission: RE | Admit: 2023-08-13 | Discharge: 2023-08-13 | Disposition: A | Discharge status: Home / Self Care | Source: Ambulatory Visit | Attending: Family Medicine | Admitting: Family Medicine

## 2023-08-13 DIAGNOSIS — M818 Other osteoporosis without current pathological fracture: Secondary | ICD-10-CM | POA: Insufficient documentation

## 2023-08-13 DIAGNOSIS — Z780 Asymptomatic menopausal state: Secondary | ICD-10-CM | POA: Diagnosis not present

## 2023-08-13 DIAGNOSIS — M8589 Other specified disorders of bone density and structure, multiple sites: Secondary | ICD-10-CM | POA: Diagnosis not present

## 2023-08-14 ENCOUNTER — Ambulatory Visit: Payer: Self-pay | Admitting: Family Medicine

## 2023-08-21 DIAGNOSIS — Z23 Encounter for immunization: Secondary | ICD-10-CM | POA: Diagnosis not present

## 2023-08-24 ENCOUNTER — Ambulatory Visit (INDEPENDENT_AMBULATORY_CARE_PROVIDER_SITE_OTHER): Admitting: Audiology

## 2023-08-24 ENCOUNTER — Ambulatory Visit (INDEPENDENT_AMBULATORY_CARE_PROVIDER_SITE_OTHER): Admitting: Otolaryngology

## 2023-08-24 VITALS — BP 143/73 | HR 70

## 2023-08-24 DIAGNOSIS — H90A31 Mixed conductive and sensorineural hearing loss, unilateral, right ear with restricted hearing on the contralateral side: Secondary | ICD-10-CM

## 2023-08-24 DIAGNOSIS — H9071 Mixed conductive and sensorineural hearing loss, unilateral, right ear, with unrestricted hearing on the contralateral side: Secondary | ICD-10-CM

## 2023-08-24 DIAGNOSIS — H9042 Sensorineural hearing loss, unilateral, left ear, with unrestricted hearing on the contralateral side: Secondary | ICD-10-CM | POA: Diagnosis not present

## 2023-08-24 DIAGNOSIS — H6691 Otitis media, unspecified, right ear: Secondary | ICD-10-CM | POA: Diagnosis not present

## 2023-08-24 DIAGNOSIS — H903 Sensorineural hearing loss, bilateral: Secondary | ICD-10-CM

## 2023-08-24 DIAGNOSIS — H663X1 Other chronic suppurative otitis media, right ear: Secondary | ICD-10-CM

## 2023-08-24 DIAGNOSIS — H90A22 Sensorineural hearing loss, unilateral, left ear, with restricted hearing on the contralateral side: Secondary | ICD-10-CM

## 2023-08-24 MED ORDER — CIPROFLOXACIN-DEXAMETHASONE 0.3-0.1 % OT SUSP: 4.0000 [drp] | Freq: Two times a day (BID) | OTIC | 8 refills | Status: AC

## 2023-08-24 NOTE — Progress Notes (Unsigned)
 Follow up: Chronic right ear infection, right ear hearing loss  HPI:  Patricia Hansen is a 83 y.o. female  [name] W [name] 08/24/2023, 12:44 PM

## 2023-08-24 NOTE — Progress Notes (Signed)
  [address] [place], [place] [address] [phone]  Audiological Evaluation    Name: BRIEL GALLICCHIO     DOB:   [birth year]      MRN:   [id number]                                                                                     Service Date: 08/24/2023     Accompanied by: unaccompanied to the booth   Patient comes today after Dr. Karis, ENT sent a referral for a hearing evaluation due to concerns with hearing loss.   Symptoms Yes Details  Hearing loss  [x]  Reports cannot hear form the right ear - not a new issue. Reports previous audiograms at Dr[facility] Roje[facility].  Tinnitus  []    Ear pain/ infections/pressure  [x]  Pain in the right ear sometimes.  Balance problems  []    Noise exposure history  []    Previous ear surgeries  []    Family history of hearing loss  []    Amplification  []    Other  []      Otoscopy: Right ear: Abnormal eardrum appearance. Left ear:  Clear external ear canal and notable landmarks visualized on the tympanic membrane.  Tympanometry: Right ear: Type B- Normal external ear canal volume with no middle ear pressure peak or tympanic membrane compliance. Left ear: Type A- Normal external ear canal volume with normal middle ear pressure and tympanic membrane compliance.    Pure tone Audiometry: Right ear- Severe to profound mixed hearing loss from 125 Hz - 8000 Hz. Left ear-  Normal hearing from [phone] Hz, then mild to moderate sensorineural hearing loss from 2000 Hz - 8000 Hz.  Speech Audiometry: Right ear- Speech Reception Threshold (SRT) was obtained at 75 dBHL, with contralateral masking Left ear-Speech Reception Threshold (SRT) was obtained at 25 dBHL.   Word Recognition Score Tested using NU-6 (recorded) Right ear: 48% was obtained at a presentation level of 105 dBHL with contralateral masking which is deemed as  poor. Left ear: 88% was obtained at a presentation level of 65 dBHL with contralateral masking which is deemed as  good .    The hearing test results were completed under headphones and results are deemed to be of fair reliability. Test technique:  conventional    Impression: Right masked bone conductions are similar to the results obtained in 2020 at Dr. Rojean clinic.  Asymmetry continues to be observed, worse for the right ear.  Recommendations: Follow up with ENT as scheduled for today. Return for a hearing evaluation if concerns with hearing changes arise or per MD recommendation. Consider a communication needs assessment after medical clearance for hearing aids is obtained and patient is ready.   Jenette Rayson MARIE LEROUX-MARTINEZ, AUD

## 2023-08-28 ENCOUNTER — Ambulatory Visit: Admitting: *Deleted

## 2023-08-28 ENCOUNTER — Ambulatory Visit

## 2023-08-28 VITALS — BP 116/57 | HR 72 | Temp 97.9°F | Resp 16 | Ht 61.0 in | Wt 169.2 lb

## 2023-08-28 DIAGNOSIS — Z0000 Encounter for general adult medical examination without abnormal findings: Secondary | ICD-10-CM | POA: Diagnosis not present

## 2023-08-28 NOTE — Patient Instructions (Addendum)
 Ms. Patricia Hansen , Thank you for taking time out of your busy schedule to complete your Annual Wellness Visit with me. I enjoyed our conversation and look forward to speaking with you again next year. I, as well as your care team,  appreciate your ongoing commitment to your health goals. Please review the following plan we discussed and let me know if I can assist you in the future. Your Game plan/ To Do List     Follow up Visits: Next Medicare AWV with our clinical staff:   08/31/24 1:40pm  Next Office Visit with your provider: 12/07/23 9:15, Dr Frann  Clinician Recommendations:  Aim for 30 minutes of exercise or brisk walking, 6-8 glasses of water, and 5 servings of fruits and vegetables each day.       This is a list of the screening recommended for you and due dates:  Health Maintenance  Topic Date Due   Medicare Annual Wellness Visit  08/04/2018   COVID-19 Vaccine (7 - 2024-25 season) 09/07/2022   Flu Shot  08/07/2023   DTaP/Tdap/Td vaccine (3 - Td or Tdap) 02/05/2028   Pneumococcal Vaccine for age over 51  Completed   DEXA scan (bone density measurement)  Completed   Zoster (Shingles) Vaccine  Completed   HPV Vaccine  Aged Out   Meningitis B Vaccine  Aged Out   Hepatitis C Screening  Discontinued    Advanced directives: (Copy Requested) Please bring a copy of your health care power of attorney and living will to the office to be added to your chart at your convenience. You can mail to [facility] [address] [place], [place] [address] or email to [email or web address] Advance Care Planning is important because it:  [x]  Makes sure you receive the medical care that is consistent with your values, goals, and preferences  [x]  It provides guidance to your family and loved ones and reduces their decisional burden about whether or not they are making the right decisions based on your wishes.  Follow the link provided in your after visit summary or read over the  paperwork we have mailed to you to help you started getting your Advance Directives in place. If you need assistance in completing these, please reach out to us  so that we can help you!  See attachments for Preventive Care and Fall Prevention Tips.

## 2023-08-28 NOTE — Progress Notes (Signed)
 Subjective:   Patricia Hansen is a 83 y.o. who presents for a Medicare Wellness preventive visit.  As a reminder, Annual Wellness Visits don't include a physical exam, and some assessments may be limited, especially if this visit is performed virtually. We may recommend an in-person follow-up visit with your provider if needed.  Visit Complete: In person  Persons Participating in Visit: Patient.  AWV Questionnaire: No: Patient Medicare AWV questionnaire was not completed prior to this visit.  Cardiac Risk Factors include: advanced age (>37men, >43 women);dyslipidemia;hypertension     Objective:    Today's Vitals   08/28/23 1340  BP: (!) 116/57  Pulse: 72  Resp: 16  Temp: 97.9 F (36.6 C)  TempSrc: Oral  SpO2: 96%  Weight: 169 lb 3.2 oz (76.7 kg)  Height: 5' 1 (1.549 m)   Body mass index is 31.97 kg/m.     08/28/2023    2:11 PM 08/21/2014   10:00 AM  Advanced Directives  Does Patient Have a Medical Advance Directive? Yes No   Type of Estate agent of [place];Living will   Does patient want to make changes to medical advance directive? No - Patient declined   Copy of Healthcare Power of Attorney in Chart? No - copy requested      Data saved with a previous flowsheet row definition    Current Medications (verified) Outpatient Encounter Medications as of 08/28/2023  Medication Sig   b complex vitamins capsule Take 1 capsule by mouth daily.   betamethasone  valerate (VALISONE ) 0.1 % cream Apply topically 2 (two) times daily.   cholecalciferol (VITAMIN D3) 25 MCG (1000 UNIT) tablet Take 5,000 Units by mouth daily.   ciprofloxacin -dexamethasone  (CIPRODEX ) OTIC suspension Place 4 drops into the right ear 2 (two) times daily.   ciprofloxacin -dexamethasone  (CIPRODEX ) OTIC suspension Place 4 drops into the right ear 2 (two) times daily for 7 days.   Ginkgo Biloba (GNP GINGKO BILOBA EXTRACT PO) Take 40 mg by mouth daily.   hydrochlorothiazide  (HYDRODIURIL )  12.5 MG tablet TAKE 1 TABLET BY MOUTH DAILY   magnesium oxide (MAG-OX) 400 (240 Mg) MG tablet Take 400 mg by mouth daily.   Misc Natural Products (GLUCOSAMINE CHOND DOUBLE STR PO) Take 1 tablet by mouth daily.   Multiple Vitamins-Minerals (HAIR SKIN NAILS PO) Take 1 each by mouth daily.   olmesartan  (BENICAR ) 20 MG tablet Take 1 tablet (20 mg total) by mouth daily.   Oyster Shell (OYSTER CALCIUM) 500 MG TABS tablet Take 500 mg of elemental calcium by mouth 2 (two) times daily.   Pancrelipase , Lip-Prot-Amyl, (CREON ) 24000-76000 units CPEP Take 1 capsule by mouth 3 times daily.   Facility-Administered Encounter Medications as of 08/28/2023  Medication   [START ON 11/08/2023] denosumab  (PROLIA ) injection 60 mg    Allergies (verified) Losartan   History: Past Medical History:  Diagnosis Date   Arthritis    Dyspepsia    chronic s/p EGD negative 2009   History of Helicobacter infection    treated x 2   Hx of colonoscopy 06/2005   no polyps found, next colonoscopy 2017   Hypertension    Intolerance of drug    biophosphates   Osteopenia    Past Surgical History:  Procedure Laterality Date   BREAST EXCISIONAL BIOPSY Right 1980   COLONOSCOPY     TUBAL LIGATION     UPPER GASTROINTESTINAL ENDOSCOPY     Family History  Problem Relation Age of Onset   Uterine cancer Mother  deceased   Hypertension Father        deceased   Other Other        no history of breast cancer, diabetes or colon cancer   Colon polyps Neg Hx    Colon cancer Neg Hx    Esophageal cancer Neg Hx    Stomach cancer Neg Hx    Rectal cancer Neg Hx    Social History   Socioeconomic History   Marital status: Married    Spouse name: Not on file   Number of children: Not on file   Years of education: Not on file   Highest education level: Not on file  Occupational History   Occupation: retired  Tobacco Use   Smoking status: Never   Smokeless tobacco: Never  Vaping Use   Vaping status: Never Used   Substance and Sexual Activity   Alcohol use: Never   Drug use: Never   Sexual activity: Never  Other Topics Concern   Not on file  Social History Narrative   Retired   Married > 40 yrs    4 daughters    Never Smoked       Alcohol use-no      Social Drivers of Corporate investment banker Strain: Low Risk  (08/28/2023)   Overall Financial Resource Strain (CARDIA)    Difficulty of Paying Living Expenses: Not very hard  Food Insecurity: No Food Insecurity (08/28/2023)   Hunger Vital Sign    Worried About Running Out of Food in the Last Year: Never true    Ran Out of Food in the Last Year: Never true  Transportation Needs: No Transportation Needs (08/28/2023)   PRAPARE - Administrator, Civil Service (Medical): No    Lack of Transportation (Non-Medical): No  Physical Activity: Not on file  Stress: No Stress Concern Present (08/28/2023)   [employer] of Occupational Health - Occupational Stress Questionnaire    Feeling of Stress: Not at all  Social Connections: Moderately Integrated (08/28/2023)   Social Connection and Isolation Panel    Frequency of Communication with Friends and Family: More than three times a week    Frequency of Social Gatherings with Friends and Family: Twice a week    Attends Religious Services: More than 4 times per year    Active Member of [employer] or Organizations: No    Attends Engineer, structural: Never    Marital Status: Married    Tobacco Counseling Counseling given: Not Answered    Clinical Intake:  Pre-visit preparation completed: Yes  Pain : No/denies pain     BMI - recorded: 31.97 Nutritional Risks: None Diabetes: No  Lab Results  Component Value Date   HGBA1C 6.3 05/29/2023   HGBA1C 6.3 11/24/2022   HGBA1C 6.3 05/19/2022     How often do you need to have someone help you when you read instructions, pamphlets, or other written materials from your doctor or pharmacy?: 1 - Never  Interpreter Needed?:  No  Information entered by :: Lolita Libra, CMA   Activities of Daily Living     08/28/2023    2:11 PM  In your present state of health, do you have any difficulty performing the following activities:  Hearing? 1  Comment has hearing loss, sees specialist  Vision? 0  Difficulty concentrating or making decisions? 0  Walking or climbing stairs? 0  Dressing or bathing? 0  Doing errands, shopping? 0  Preparing Food and eating ? N  Using  the Toilet? N  In the past six months, have you accidently leaked urine? N  Do you have problems with loss of bowel control? N  Managing your Medications? N  Managing your Finances? N  Housekeeping or managing your Housekeeping? N    Patient Care Team: Frann Mabel Mt, DO as PCP - General (Family Medicine) Glendia Simmonds, OD as Referring Physician (Optometry) Karis Daniel Motts, MD as Referring Physician (Otolaryngology)  I have updated your Care Teams any recent Medical Services you may have received from other providers in the past year.     Assessment:   This is a routine wellness examination for Patricia Hansen.  Hearing/Vision screen Hearing Screening - Comments:: Has decreased hearing, sees specialist Vision Screening - Comments:: Last eye exam &gt; 1 year ago, will consider scheduling.   Goals Addressed   None    Depression Screen     08/28/2023    1:54 PM 06/13/2022    2:39 PM 11/25/2021    8:19 AM 05/24/2021    1:43 PM 05/22/2020    1:50 PM 05/22/2020    1:31 PM  PHQ 2/9 Scores  PHQ - 2 Score 0 0 0 0 0 0  PHQ- 9 Score 0  0       Fall Risk     08/28/2023    1:53 PM 06/13/2022    2:39 PM 11/25/2021    8:18 AM 05/24/2021    1:43 PM 05/22/2020    1:49 PM  Fall Risk   Falls in the past year? 0 0 0 0 0  Number falls in past yr: 0 0 0 0 0  Injury with Fall? 0 0 0 0 0  Risk for fall due to : No Fall Risks No Fall Risks Other (Comment) No Fall Risks   Follow up Education provided Falls evaluation completed Falls prevention discussed   Falls evaluation completed  Falls evaluation completed      Data saved with a previous flowsheet row definition    MEDICARE RISK AT HOME:  Medicare Risk at Home Any stairs in or around the home?: No If so, are there any without handrails?: No Home free of loose throw rugs in walkways, pet beds, electrical cords, etc?: Yes Adequate lighting in your home to reduce risk of falls?: Yes Life alert?: No Use of a cane, walker or w/c?: No Grab bars in the bathroom?: No Shower chair or bench in shower?: No Elevated toilet seat or a handicapped toilet?: No  TIMED UP AND GO:  Was the test performed?  Yes  Length of time to ambulate 10 feet: 6 sec Gait steady and fast without use of assistive device  Cognitive Function: 6CIT completed        08/28/2023    1:55 PM  6CIT Screen  What Year? 0 points  What month? 0 points  What time? 0 points  Count back from 20 0 points  Months in reverse 0 points  Repeat phrase 0 points  Total Score 0 points    Immunizations Immunization History  Administered Date(s) Administered    sv, Bivalent, Protein Subunit Rsvpref,pf [place]) 11/18/2021   Fluad Quad(high Dose 65+) 10/11/2018, 10/25/2021   Influenza Split 10/16/2010, 09/22/2011   Influenza Whole 10/04/2007, 10/02/2008, 09/07/2009   Influenza, High Dose Seasonal PF 10/12/2017, 09/11/2019, 09/22/2022, 08/21/2023   Influenza,inj,Quad PF,6+ Mos 09/11/2019, 10/14/2020   Influenza-Unspecified 10/20/2014, 09/13/2015, 09/15/2016   Moderna Covid-19 Fall Seasonal Vaccine 85yrs & older 10/25/2021, 09/22/2022, 08/21/2023   PFIZER(Purple Top)SARS-COV-2 Vaccination 01/21/2019, 02/11/2019,  10/06/2019, 06/12/2020   Pfizer Covid-19 Vaccine Bivalent Booster 70yrs & up 01/16/2021   Pneumococcal Conjugate-13 05/13/2016   Pneumococcal Polysaccharide-23 07/28/2007, 10/25/2009   Td 12/13/2007   Tdap 02/04/2018   Zoster Recombinant(Shingrix ) 08/20/2017, 10/21/2017   Zoster, Live 10/26/2008    Screening  Tests Health Maintenance  Topic Date Due   Medicare Annual Wellness (AWV)  08/04/2018   DTaP/Tdap/Td (3 - Td or Tdap) 02/05/2028   Pneumococcal Vaccine: 50+ Years  Completed   INFLUENZA VACCINE  Completed   DEXA SCAN  Completed   COVID-19 Vaccine  Completed   Zoster Vaccines- Shingrix   Completed   HPV VACCINES  Aged Out   Meningococcal B Vaccine  Aged Out   Hepatitis C Screening  Discontinued    Health Maintenance  Health Maintenance Due  Topic Date Due   Medicare Annual Wellness (AWV)  08/04/2018   Health Maintenance Items Addressed: All HM up to date.  Additional Screening:  Vision Screening: Recommended annual ophthalmology exams for early detection of glaucoma and other disorders of the eye. Would you like a referral to an eye doctor? No    Dental Screening: Recommended annual dental exams for proper oral hygiene  Community Resource Referral / Chronic Care Management: CRR required this visit?  No   CCM required this visit?  No   Plan:    I have personally reviewed and noted the following in the patient's chart:   Medical and social history Use of alcohol, tobacco or illicit drugs  Current medications and supplements including opioid prescriptions. Patient is not currently taking opioid prescriptions. Functional ability and status Nutritional status Physical activity Advanced directives List of other physicians Hospitalizations, surgeries, and ER visits in previous 12 months Vitals Screenings to include cognitive, depression, and falls Referrals and appointments  In addition, I have reviewed and discussed with patient certain preventive protocols, quality metrics, and best practice recommendations. A written personalized care plan for preventive services as well as general preventive health recommendations were provided to patient.   Lolita Libra, CMA   08/28/2023   After Visit Summary: (In Person-Printed) AVS printed and given to the  patient  Notes: Nothing significant to report at this time.

## 2023-09-22 ENCOUNTER — Ambulatory Visit

## 2023-10-12 ENCOUNTER — Telehealth: Payer: Self-pay

## 2023-10-12 ENCOUNTER — Other Ambulatory Visit (HOSPITAL_COMMUNITY): Payer: Self-pay

## 2023-10-12 NOTE — Telephone Encounter (Signed)
 Patricia Hansen

## 2023-10-12 NOTE — Telephone Encounter (Signed)
 Pt ready for scheduling for PROLIA  on or after : 11/12/23  Option# 1: Buy/Bill (Office supplied medication)  Out-of-pocket cost due at time of clinic visit: $0  Number of injection/visits approved: ---  Primary: MEDICARE Prolia  co-insurance: 0% Admin fee co-insurance: 0%  Secondary: UHC AARP-MEDSUP Prolia  co-insurance:  Admin fee co-insurance:   Medical Benefit Details: Date Benefits were checked: 10/12/23 Deductible: $257 Met of $257 Required/ Coinsurance: 0%/ Admin Fee: 0%  Prior Auth: N/A PA# Expiration Date:   # of doses approved: ----------------------------------------------------------------------- Option# 2- Med Obtained from pharmacy: Prolia  is no longer preferred for pharmacy benefit. Jubbonti is now preferred. PRICING IS FOR JUBBONTI  Pharmacy benefit: Copay $724.07 (Paid to pharmacy) Admin Fee: 0% (Pay at clinic)  Prior Auth: N/A PA# Expiration Date:   # of doses approved:   If patient wants fill through the pharmacy benefit please send prescription to: [facility], and include estimated need by date in rx notes. Pharmacy will ship medication directly to the office.  Patient NOT eligible for Prolia  Copay Card. Copay Card can make patient's cost as little as $25. Link to apply: [email or web address]://www.amgensupportplus.com/copay  ** This summary of benefits is an estimation of the patient's out-of-pocket cost. Exact cost may very based on individual plan coverage.

## 2023-10-12 NOTE — Telephone Encounter (Signed)
 Prolia VOB initiated via [email or web address]  Next Prolia inj DUE: 01/22/23

## 2023-11-13 ENCOUNTER — Ambulatory Visit: Admitting: *Deleted

## 2023-11-13 DIAGNOSIS — M818 Other osteoporosis without current pathological fracture: Secondary | ICD-10-CM

## 2023-11-13 MED ORDER — DENOSUMAB 60 MG/ML ~~LOC~~ SOSY: 60.0000 mg | PREFILLED_SYRINGE | SUBCUTANEOUS | Status: AC

## 2023-11-13 NOTE — Progress Notes (Signed)
 Patient here for Prolia  injection per physicians orders  Prolia  60 mg SQ , was administered left arm today. Patient tolerated injection.  Patient next injection due: 6 months, appt made:  No- will schedule in 5 months after benefits are ran again  Initial injection: no  Did Prolia  come from pharmacy (if yes please select patient supplied): no  Cam placed for next injection: yes

## 2023-11-30 ENCOUNTER — Other Ambulatory Visit (INDEPENDENT_AMBULATORY_CARE_PROVIDER_SITE_OTHER)

## 2023-11-30 ENCOUNTER — Ambulatory Visit: Payer: Self-pay | Admitting: Family Medicine

## 2023-11-30 DIAGNOSIS — R7303 Prediabetes: Secondary | ICD-10-CM

## 2023-11-30 DIAGNOSIS — I10 Essential (primary) hypertension: Secondary | ICD-10-CM

## 2023-11-30 LAB — CBC
HCT: 38.3 % (ref 36.0–46.0)
Hemoglobin: 12.6 g/dL (ref 12.0–15.0)
MCHC: 32.8 g/dL (ref 30.0–36.0)
MCV: 94.1 fl (ref 78.0–100.0)
Platelets: 214 K/uL (ref 150.0–400.0)
RBC: 4.07 Mil/uL (ref 3.87–5.11)
RDW: 13.4 % (ref 11.5–15.5)
WBC: 5 K/uL (ref 4.0–10.5)

## 2023-11-30 LAB — COMPREHENSIVE METABOLIC PANEL WITH GFR
ALT: 20 U/L (ref 0–35)
AST: 20 U/L (ref 0–37)
Albumin: 4.4 g/dL (ref 3.5–5.2)
Alkaline Phosphatase: 39 U/L (ref 39–117)
BUN: 21 mg/dL (ref 6–23)
CO2: 33 meq/L — ABNORMAL HIGH (ref 19–32)
Calcium: 9.1 mg/dL (ref 8.4–10.5)
Chloride: 104 meq/L (ref 96–112)
Creatinine, Ser: 0.7 mg/dL (ref 0.40–1.20)
GFR: 80.86 mL/min (ref >60.00)
Glucose, Bld: 99 mg/dL (ref 70–99)
Potassium: 5 meq/L (ref 3.5–5.1)
Sodium: 141 meq/L (ref 135–145)
Total Bilirubin: 0.4 mg/dL (ref 0.2–1.2)
Total Protein: 7 g/dL (ref 6.0–8.3)

## 2023-11-30 LAB — LIPID PANEL
Cholesterol: 182 mg/dL (ref 0–200)
HDL: 60.4 mg/dL (ref >39.00)
LDL Cholesterol: 90 mg/dL (ref 0–99)
NonHDL: 121.1
Total CHOL/HDL Ratio: 3
Triglycerides: 156 mg/dL — ABNORMAL HIGH (ref 0.0–149.0)
VLDL: 31.2 mg/dL (ref 0.0–40.0)

## 2023-11-30 LAB — HEMOGLOBIN A1C: Hgb A1c MFr Bld: 6.1 % (ref 4.6–6.5)

## 2023-12-07 ENCOUNTER — Ambulatory Visit: Admitting: Family Medicine

## 2023-12-07 ENCOUNTER — Encounter: Payer: Self-pay | Admitting: Family Medicine

## 2023-12-07 VITALS — BP 112/74 | HR 67 | Temp 98.0°F | Resp 16 | Ht 61.0 in | Wt 169.6 lb

## 2023-12-07 DIAGNOSIS — I10 Essential (primary) hypertension: Secondary | ICD-10-CM

## 2023-12-07 DIAGNOSIS — R7303 Prediabetes: Secondary | ICD-10-CM | POA: Diagnosis not present

## 2023-12-07 DIAGNOSIS — R1013 Epigastric pain: Secondary | ICD-10-CM

## 2023-12-07 DIAGNOSIS — L239 Allergic contact dermatitis, unspecified cause: Secondary | ICD-10-CM | POA: Diagnosis not present

## 2023-12-07 MED ORDER — CREON 24000-76000 UNITS PO CPEP: ORAL_CAPSULE | ORAL | 3 refills | Status: AC

## 2023-12-07 MED ORDER — OLMESARTAN MEDOXOMIL 20 MG PO TABS: 20.0000 mg | ORAL_TABLET | Freq: Every day | ORAL | 3 refills | Status: AC

## 2023-12-07 MED ORDER — BETAMETHASONE VALERATE 0.1 % EX CREA: TOPICAL_CREAM | Freq: Two times a day (BID) | CUTANEOUS | 2 refills | Status: AC

## 2023-12-07 NOTE — Progress Notes (Addendum)
 Chief Complaint  Patient presents with   Follow-up    Follow Up    Subjective Patricia Hansen is a 83 y.o. female who presents for hypertension follow up. She does not monitor home blood pressures. She is compliant with medications-olmesartan  20 mg daily, hydrochlorothiazide  12.5 mg daily. Patient has these side effects of medication: none She is adhering to a healthy diet overall. Current exercise: Walking, golf No CP or SOB.   Patient has a history of chronic pancreatic insufficiency versus dyspepsia taking Creon  3 times daily as needed.  She reports compliance, no adverse effects.  It is controlling her symptoms.   Past Medical History:  Diagnosis Date   Arthritis    Dyspepsia    chronic s/p EGD negative 2009   History of Helicobacter infection    treated x 2   Hx of colonoscopy 06/2005   no polyps found, next colonoscopy 2017   Hypertension    Intolerance of drug    biophosphates   Osteopenia     Exam BP 112/74 (BP Location: Left Arm, Patient Position: Sitting)   Pulse 67   Temp 98 F (36.7 C) (Oral)   Resp 16   Ht 5' 1 (1.549 m)   Wt 169 lb 9.6 oz (76.9 kg)   SpO2 97%   BMI 32.05 kg/m  General:  well developed, well nourished, in no apparent distress Heart: RRR, no bruits, 1+ pitting bilateral LE edema tapering at the mid tibia Lungs: clear to auscultation, no accessory muscle use Psych: well oriented with normal range of affect and appropriate judgment/insight  Essential hypertension - Plan: CBC with Differential/Platelet, Comprehensive metabolic panel with GFR, Lipid panel, olmesartan  (BENICAR ) 20 MG tablet  Dyspepsia - Plan: Pancrelipase , Lip-Prot-Amyl, (CREON ) 24000-76000 units CPEP  Prediabetes - Plan: HgB A1c  Allergic dermatitis - Plan: betamethasone  valerate (VALISONE ) 0.1 % cream  Chronic, stable.  Continue hydrochlorothiazide  12.5 mg daily, olmesartan  40 mg daily.  Counseled on diet and exercise. Chronic, stable.  Continue Creon  3 times daily as  needed. F/u in 6 months.  Labs 1 week prior. The patient voiced understanding and agreement to the plan.  Mabel Mt [place], DO 12/07/23  9:44 AM

## 2023-12-07 NOTE — Patient Instructions (Signed)
 Keep the diet clean and stay active.  Let us know if you need anything.

## 2023-12-15 DIAGNOSIS — Z23 Encounter for immunization: Secondary | ICD-10-CM | POA: Diagnosis not present

## 2024-01-18 ENCOUNTER — Other Ambulatory Visit: Payer: Self-pay | Admitting: Family Medicine

## 2024-01-18 DIAGNOSIS — I10 Essential (primary) hypertension: Secondary | ICD-10-CM

## 2024-05-31 ENCOUNTER — Other Ambulatory Visit

## 2024-06-06 ENCOUNTER — Ambulatory Visit: Admitting: Family Medicine

## 2024-08-31 ENCOUNTER — Ambulatory Visit
